# Patient Record
Sex: Female | Born: 1975 | Race: Black or African American | Hispanic: No | Marital: Married | Smoking: Never smoker
Health system: Southern US, Community
[De-identification: ages and names within clinical notes are randomized; demographics above are authoritative.]

## PROBLEM LIST (undated history)

## (undated) DIAGNOSIS — D259 Leiomyoma of uterus, unspecified: Secondary | ICD-10-CM

## (undated) DIAGNOSIS — Z973 Presence of spectacles and contact lenses: Secondary | ICD-10-CM

## (undated) DIAGNOSIS — K7469 Other cirrhosis of liver: Secondary | ICD-10-CM

## (undated) DIAGNOSIS — N920 Excessive and frequent menstruation with regular cycle: Secondary | ICD-10-CM

## (undated) DIAGNOSIS — K754 Autoimmune hepatitis: Secondary | ICD-10-CM

## (undated) DIAGNOSIS — D509 Iron deficiency anemia, unspecified: Secondary | ICD-10-CM

## (undated) HISTORY — PX: BUNIONECTOMY: SHX129

---

## 1997-06-25 HISTORY — PX: BUNIONECTOMY: SHX129

## 2000-01-08 ENCOUNTER — Encounter: Admission: RE | Admit: 2000-01-08 | Discharge: 2000-01-08 | Payer: Self-pay

## 2006-07-17 ENCOUNTER — Encounter: Payer: Self-pay | Admitting: Family Medicine

## 2006-07-17 LAB — CONVERTED CEMR LAB
HCT: 32.3 % — ABNORMAL LOW (ref 36.0–46.0)
Hemoglobin: 10.9 g/dL — ABNORMAL LOW (ref 12.0–15.0)
MCV: 83 fL (ref 78.0–100.0)
Platelets: 328 10*3/uL (ref 150–400)
Prolactin: 10.5 ng/mL
RDW: 14.4 % — ABNORMAL HIGH (ref 11.5–14.0)
WBC: 7.3 10*3/uL (ref 4.0–10.5)

## 2006-08-22 ENCOUNTER — Ambulatory Visit (HOSPITAL_COMMUNITY): Admission: RE | Admit: 2006-08-22 | Discharge: 2006-08-22 | Payer: Self-pay | Admitting: Obstetrics and Gynecology

## 2006-08-22 ENCOUNTER — Encounter (INDEPENDENT_AMBULATORY_CARE_PROVIDER_SITE_OTHER): Payer: Self-pay | Admitting: Specialist

## 2006-08-22 HISTORY — PX: HYSTEROSCOPY WITH D & C: SHX1775

## 2007-05-28 ENCOUNTER — Encounter: Admission: RE | Admit: 2007-05-28 | Discharge: 2007-05-28 | Payer: Self-pay | Admitting: Obstetrics and Gynecology

## 2007-07-01 ENCOUNTER — Ambulatory Visit (HOSPITAL_COMMUNITY): Admission: RE | Admit: 2007-07-01 | Discharge: 2007-07-01 | Payer: Self-pay | Admitting: Obstetrics and Gynecology

## 2008-05-11 ENCOUNTER — Encounter: Admission: RE | Admit: 2008-05-11 | Discharge: 2008-05-11 | Payer: Self-pay | Admitting: Obstetrics and Gynecology

## 2008-12-06 IMAGING — RF DG HYSTEROGRAM
3 series · 3 of 3 positions shown · non-contrast
Comparison: none

07/02/07 ? CORRECTED REPORT ? This exam was performed by Dr. Mariuxi Milford.
CLINICAL DATA: Infertility.
 HYSTEROSALPINGOGRAM:
TECHNIQUE: The exam was performed by Dr. Eveling and 3 images are submitted for interpretation.

[Series 1: run · 1 of 1 slices shown (1 of 3)]
[im 1/1]
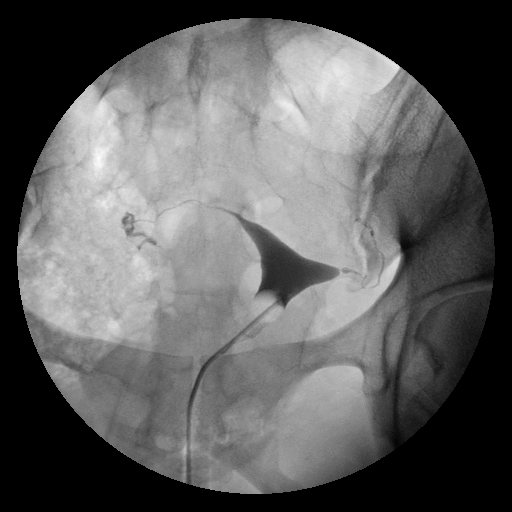

[Series 2: run · 1 of 1 slices shown (2 of 3)]
[im 1/1]
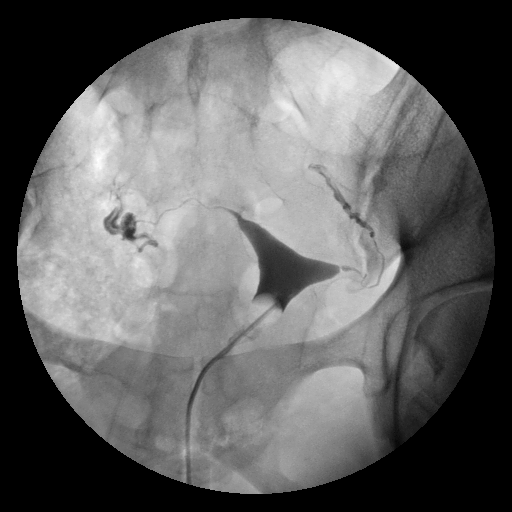

[Series 3: run · 1 of 1 slices shown (3 of 3)]
[im 1/1]
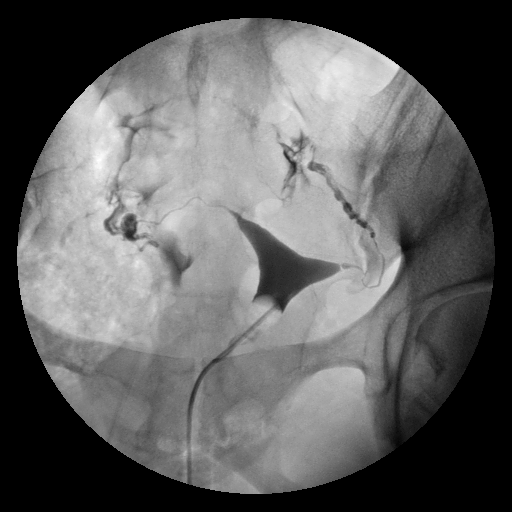

[3 of 3 positions shown; findings below may reference images not displayed]

FINDINGS: The uterus has a normal appearance with no fixed filling defects. Both fallopian tubes spill and have a normal appearance. There is free spill of contrast into both adnexal regions.
IMPRESSION: Normal HSG.

## 2009-07-27 ENCOUNTER — Ambulatory Visit (HOSPITAL_COMMUNITY): Admission: RE | Admit: 2009-07-27 | Discharge: 2009-07-27 | Payer: Self-pay | Admitting: Obstetrics and Gynecology

## 2009-08-31 ENCOUNTER — Ambulatory Visit (HOSPITAL_COMMUNITY): Admission: RE | Admit: 2009-08-31 | Discharge: 2009-08-31 | Payer: Self-pay | Admitting: Obstetrics and Gynecology

## 2009-09-13 ENCOUNTER — Inpatient Hospital Stay (HOSPITAL_COMMUNITY): Admission: AD | Admit: 2009-09-13 | Discharge: 2009-09-17 | Payer: Self-pay | Admitting: Obstetrics and Gynecology

## 2009-09-13 ENCOUNTER — Encounter (INDEPENDENT_AMBULATORY_CARE_PROVIDER_SITE_OTHER): Payer: Self-pay | Admitting: Obstetrics and Gynecology

## 2009-09-13 ENCOUNTER — Encounter: Payer: Self-pay | Admitting: Obstetrics and Gynecology

## 2009-09-23 DEATH — deceased

## 2010-09-17 LAB — TORCH-IGM(TOXO/ RUB/ CMV/ HSV) W TITER
CMV IgM: 0.28 Index (ref <0.90)
HSV IgM Ab SCREEN: NOT DETECTED
Rubella IgM Index: <0.9 Ratio (ref <0.90)
Toxoplasma IgM: NEGATIVE

## 2010-09-17 LAB — COMPREHENSIVE METABOLIC PANEL
ALT: 13 U/L (ref 0–35)
AST: 32 U/L (ref 0–37)
AST: 34 U/L (ref 0–37)
Albumin: 2.5 g/dL — ABNORMAL LOW (ref 3.5–5.2)
Alkaline Phosphatase: 101 U/L (ref 39–117)
Alkaline Phosphatase: 101 U/L (ref 39–117)
Alkaline Phosphatase: 133 U/L — ABNORMAL HIGH (ref 39–117)
BUN: 6 mg/dL (ref 6–23)
CO2: 24 mEq/L (ref 19–32)
Calcium: 8.4 mg/dL (ref 8.4–10.5)
Calcium: 8.7 mg/dL (ref 8.4–10.5)
Chloride: 104 mEq/L (ref 96–112)
Chloride: 107 mEq/L (ref 96–112)
Creatinine, Ser: 0.69 mg/dL (ref 0.4–1.2)
GFR calc Af Amer: >60 mL/min (ref >60)
GFR calc non Af Amer: >60 mL/min (ref >60)
GFR calc non Af Amer: >60 mL/min (ref >60)
Glucose, Bld: 70 mg/dL (ref 70–99)
Glucose, Bld: 91 mg/dL (ref 70–99)
Potassium: 3.5 mEq/L (ref 3.5–5.1)
Potassium: 4.1 mEq/L (ref 3.5–5.1)
Sodium: 137 mEq/L (ref 135–145)
Total Bilirubin: 0.1 mg/dL — ABNORMAL LOW (ref 0.3–1.2)
Total Bilirubin: 0.4 mg/dL (ref 0.3–1.2)
Total Protein: 6.2 g/dL (ref 6.0–8.3)
Total Protein: 7.4 g/dL (ref 6.0–8.3)

## 2010-09-17 LAB — LUPUS ANTICOAGULANT PANEL
Lupus Anticoagulant: NOT DETECTED
dRVVT Incubated 1:1 Mix: 40.1 secs (ref 36.2–44.3)

## 2010-09-17 LAB — URINALYSIS, MICROSCOPIC ONLY
Bilirubin Urine: NEGATIVE
Ketones, ur: NEGATIVE mg/dL
Protein, ur: 30 mg/dL — AB
Urobilinogen, UA: 0.2 mg/dL (ref 0.0–1.0)

## 2010-09-17 LAB — ANTI-NUCLEAR AB-TITER (ANA TITER): ANA Titer 1: 1:640 {titer} — ABNORMAL HIGH

## 2010-09-17 LAB — CBC
HCT: 25.9 % — ABNORMAL LOW (ref 36.0–46.0)
Hemoglobin: 10.5 g/dL — ABNORMAL LOW (ref 12.0–15.0)
Hemoglobin: 8.9 g/dL — ABNORMAL LOW (ref 12.0–15.0)
Platelets: 183 10*3/uL (ref 150–400)
Platelets: 187 10*3/uL (ref 150–400)
RBC: 2.82 MIL/uL — ABNORMAL LOW (ref 3.87–5.11)
RBC: 3.43 MIL/uL — ABNORMAL LOW (ref 3.87–5.11)
RDW: 12.7 % (ref 11.5–15.5)
RDW: 13.2 % (ref 11.5–15.5)
WBC: 17.4 10*3/uL — ABNORMAL HIGH (ref 4.0–10.5)
WBC: 19.3 10*3/uL — ABNORMAL HIGH (ref 4.0–10.5)

## 2010-09-17 LAB — RPR: RPR Ser Ql: NONREACTIVE

## 2010-09-17 LAB — PROTEIN, URINE, 24 HOUR
Collection Interval-UPROT: 24 hours
Protein, 24H Urine: 741 mg/d — ABNORMAL HIGH (ref 50–100)
Protein, Urine: 26 mg/dL

## 2010-09-17 LAB — CARDIOLIPIN ANTIBODIES, IGG, IGM, IGA
Anticardiolipin IgA: 14 APL U/mL (ref <22)
Anticardiolipin IgG: 3 GPL U/mL — ABNORMAL LOW (ref <23)
Anticardiolipin IgM: 2 MPL U/mL — ABNORMAL LOW (ref <11)

## 2010-09-17 LAB — CREATININE, URINE, 24 HOUR: Collection Interval-UCRE24: 24 hours

## 2010-09-17 LAB — LACTATE DEHYDROGENASE: LDH: 254 U/L — ABNORMAL HIGH (ref 94–250)

## 2010-09-17 LAB — URINE CULTURE

## 2010-09-17 LAB — MRSA PCR SCREENING: MRSA by PCR: NEGATIVE

## 2010-09-17 LAB — URIC ACID: Uric Acid, Serum: 4.8 mg/dL (ref 2.4–7.0)

## 2010-11-10 NOTE — Op Note (Signed)
NAMESHOMARI, MATUSIK    ACCOUNT NO.:  [id number]   MEDICAL RECORD NO.:  [id number]          PATIENT TYPE:  AMB   LOCATION:  SDC                           FACILITY:  [facility]   PHYSICIAN:  Hal Morales, M.D.DATE OF BIRTH:  06/06/1976   DATE OF PROCEDURE:  08/22/2006  DATE OF DISCHARGE:                               OPERATIVE REPORT   PREOPERATIVE DIAGNOSIS:  Premenstrual spotting and endometrial polyp.   POSTOPERATIVE DIAGNOSIS:  Premenstrual spotting, necrotic endometrial  polypoid lesion.   PROCEDURE:  Hysteroscopy, dilatation and curettage.   SURGEON:  Hal Morales, M.D.   ANESTHESIA:  General LMA.   ESTIMATED BLOOD LOSS:  Less than 10 mL.   COMPLICATIONS:  None.   FINDINGS:  At the time of hysteroscopy, there was a fairly large  necrotic polypoid looking area that initially was thought to be clot.  There was no clear attachment to the endometrial cavity.  The remainder  of the cavity was clear without other endometrial lesions.  There were  no endocervical lesions noted at the time of hysteroscopy.   PROCEDURE:  The patient was taken to the operating room after  appropriate identification and placed on the operating table.  After  institution of general anesthesia, she was placed in the lithotomy  position.  The perineum and vagina were prepped with multiple layers of  Betadine and a non-latex catheter used to empty the bladder under  sterile conditions.  The perineum was draped as a sterile field.  A  weighted speculum was placed in the posterior vagina and a paracervical  block achieved with a total of 10 mL of 2% Xylocaine at the 5 and 7  o'clock positions.  The cervix was then grasped anteriorly with a single  tooth tenaculum and the uterus sounded to 9 cm.  The cervix was then  dilated to a #23 dilator to accommodate the diagnostic hysteroscope.  The diagnostic hysteroscope was then used to observe and document the  above noted findings.  The  Randall stone forceps were used to remove the  necrotic looking polypoid material and visualization of the endometrial  cavity revealed no other lesions.  The endometrial cavity was sharply  curetted and those curettings removed from the operative field.  At this  time, all instruments were removed from the vagina and the patient was  awakened from general anesthesia.  She was taken from the operating room  to the recovery room in satisfactory condition having tolerated the  procedure well with sponge and instrument counts correct.   SPECIMENS TO PATHOLOGY:  Endometrial curettings.   DISCHARGE INSTRUCTIONS:  The patient is given printed instructions for  D&C from the [facility].   DISCHARGE MEDICATIONS:  Prenatal vitamins one p.o. daily, ibuprofen over-  the-counter 600 mg p.o. q.6-8h. p.r.n. pain, clomiphene citrate 50 mg  one p.o. daily from February 28 through March 3.   FOLLOW-UP INSTRUCTIONS:  The patient is to follow-up in two weeks at  [facility].      Hal Morales, M.D.  Electronically Signed     VPH/MEDQ  D:  08/22/2006  T:  08/22/2006  Job:  [id number]

## 2010-12-18 ENCOUNTER — Other Ambulatory Visit: Payer: Self-pay | Admitting: Obstetrics and Gynecology

## 2010-12-18 DIAGNOSIS — Z1231 Encounter for screening mammogram for malignant neoplasm of breast: Secondary | ICD-10-CM

## 2010-12-26 ENCOUNTER — Ambulatory Visit
Admission: RE | Admit: 2010-12-26 | Discharge: 2010-12-26 | Disposition: A | Discharge status: Home / Self Care | Payer: Managed Care, Other (non HMO) | Source: Ambulatory Visit | Attending: Obstetrics and Gynecology | Admitting: Obstetrics and Gynecology

## 2010-12-26 DIAGNOSIS — Z1231 Encounter for screening mammogram for malignant neoplasm of breast: Secondary | ICD-10-CM

## 2011-02-19 IMAGING — US US OB FOLLOW-UP
1 series · 14 of 28 positions shown · non-contrast
Comparison: none

OBSTETRICAL ULTRASOUND:
 This ultrasound was performed in The [HOSPITAL], and the [facility] report will be stored to [REDACTED] PACS.  This report is also available in [HOSPITAL]?s accessANYware.

[Series 1: us ob follow-up · 14 of 69 slices shown]
[im 3/69]
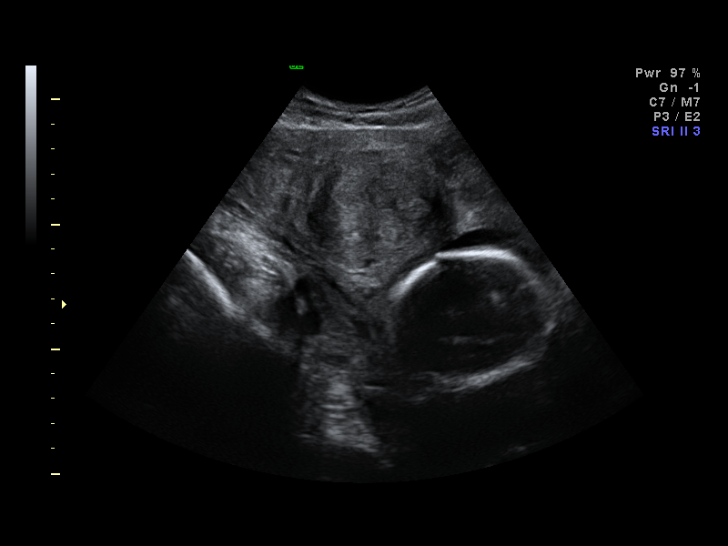
[im 8/69]
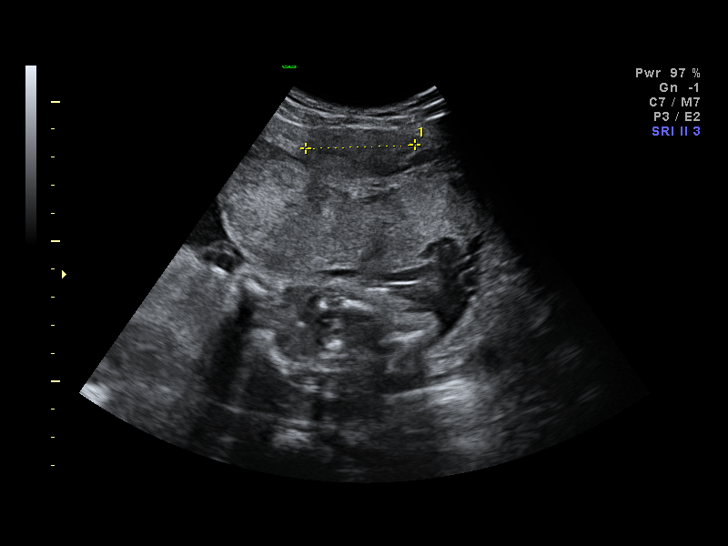
[im 13/69]
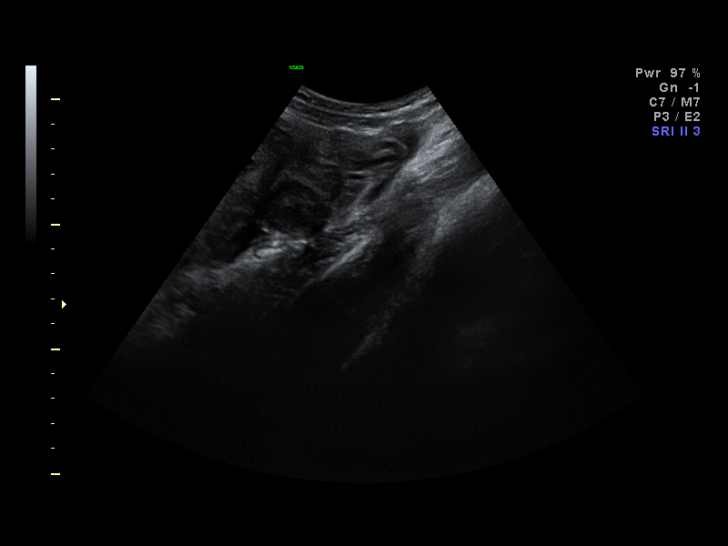
[im 18/69]
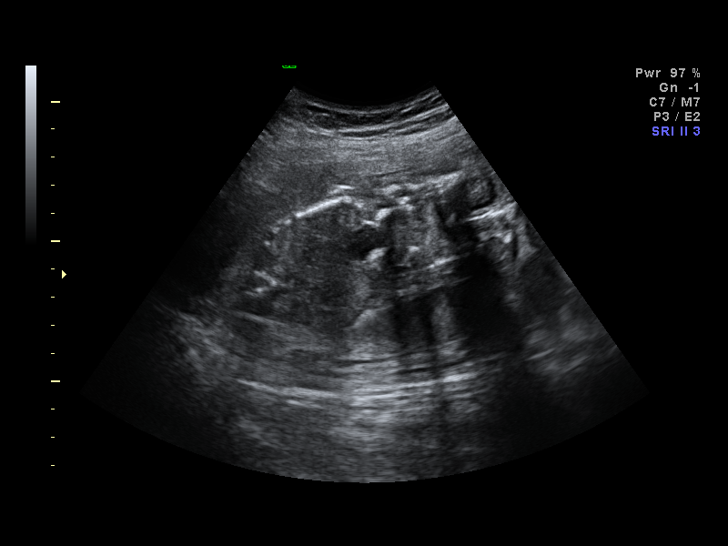
[im 23/69]
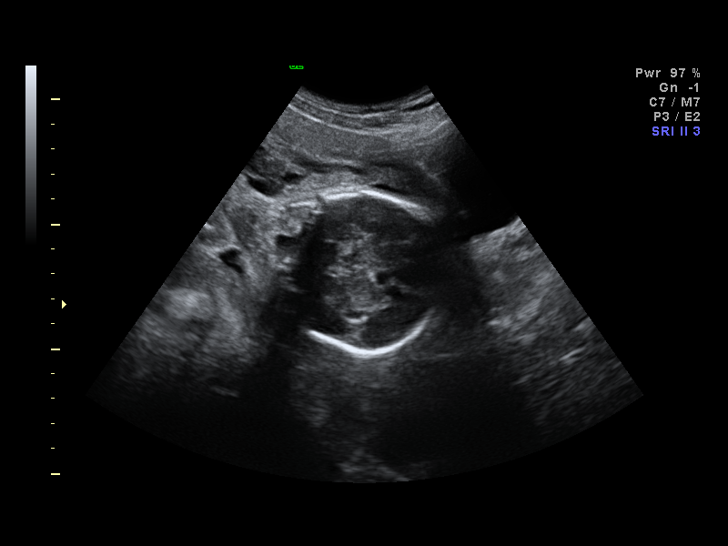
[im 28/69]
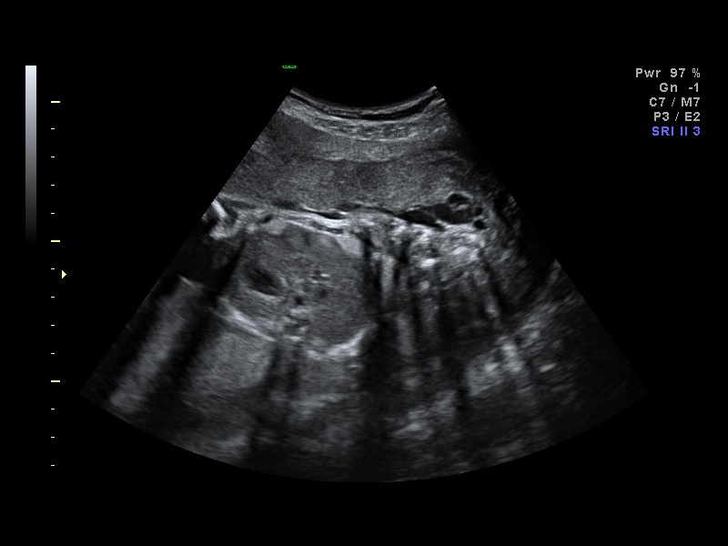
[im 33/69]
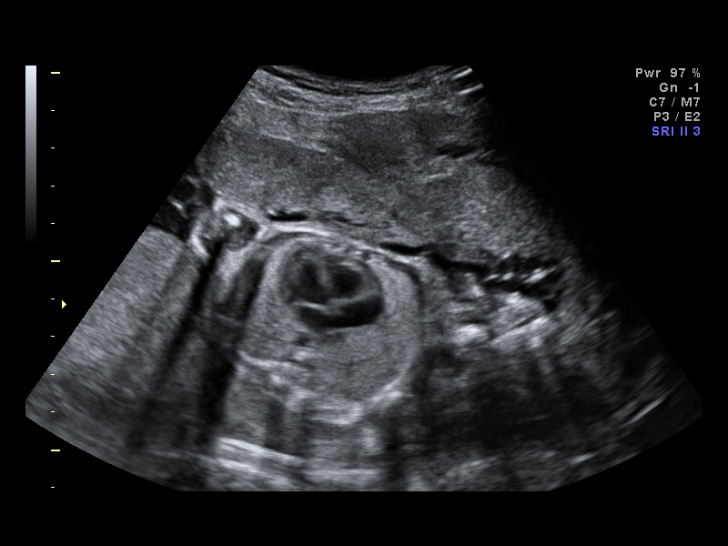
[im 38/69]
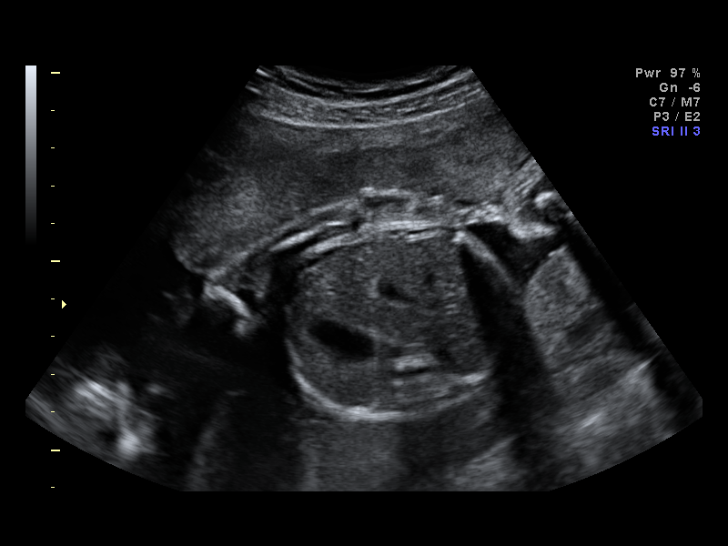
[im 43/69]
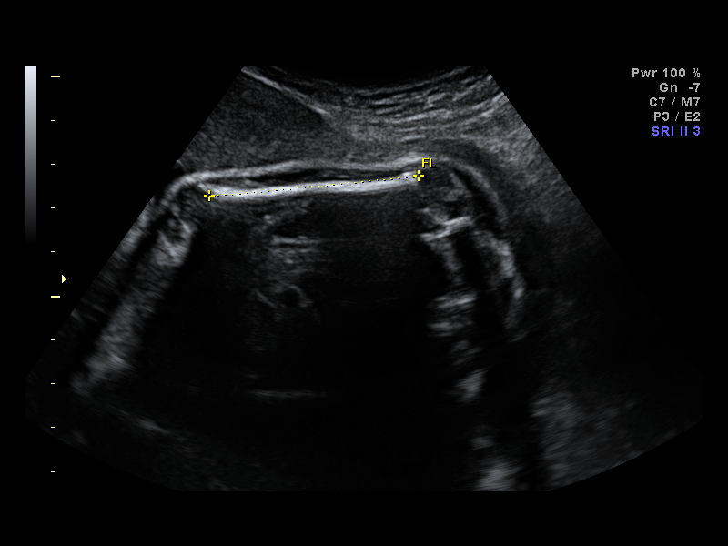
[im 48/69]
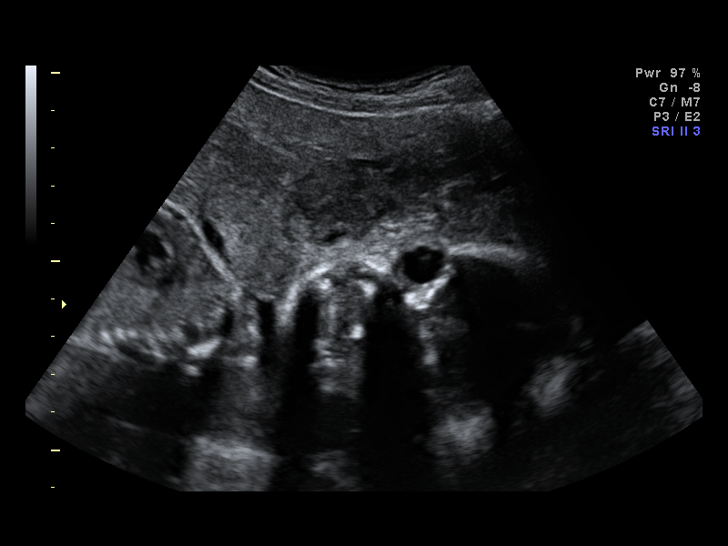
[im 53/69]
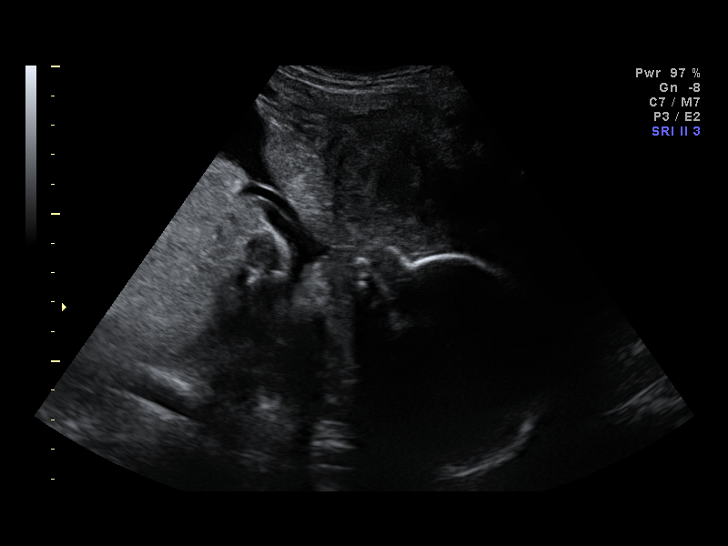
[im 58/69]
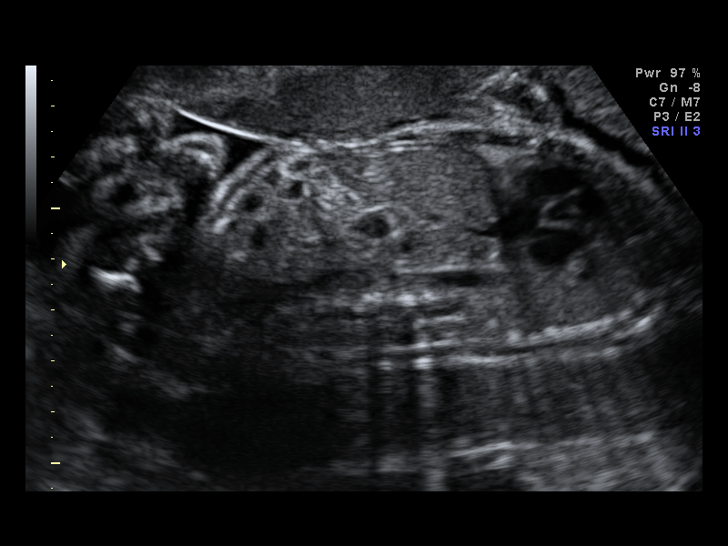
[im 63/69]
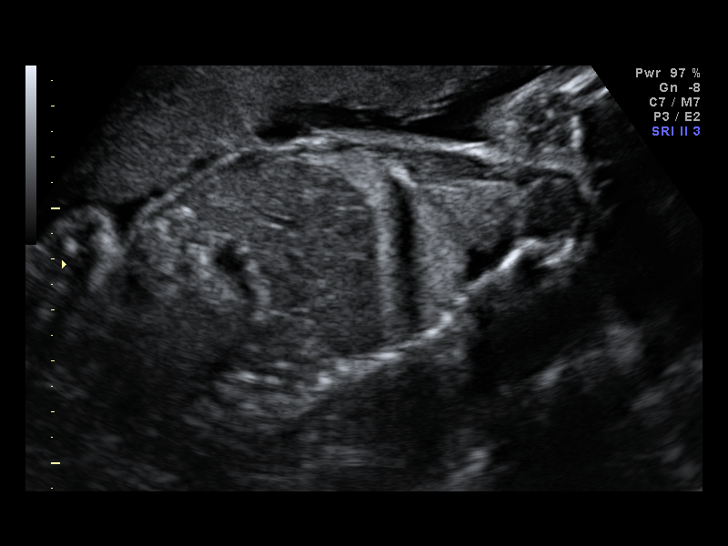
[im 69/69]
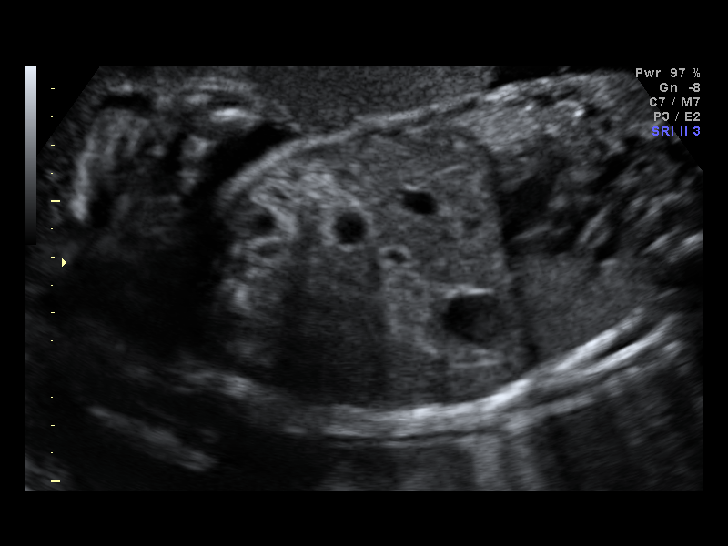

[14 of 28 positions shown; findings below may reference images not displayed]

IMPRESSION: [facility] has also been faxed to the ordering physician.

## 2011-05-15 ENCOUNTER — Encounter (HOSPITAL_COMMUNITY): Payer: Self-pay

## 2011-05-15 ENCOUNTER — Inpatient Hospital Stay (HOSPITAL_COMMUNITY): Payer: Managed Care, Other (non HMO)

## 2011-05-15 ENCOUNTER — Inpatient Hospital Stay (HOSPITAL_COMMUNITY)
Admission: AD | Admit: 2011-05-15 | Discharge: 2011-05-15 | Disposition: A | Discharge status: Home / Self Care | Payer: Managed Care, Other (non HMO) | Source: Ambulatory Visit | Attending: Obstetrics and Gynecology | Admitting: Obstetrics and Gynecology

## 2011-05-15 DIAGNOSIS — O09529 Supervision of elderly multigravida, unspecified trimester: Secondary | ICD-10-CM

## 2011-05-15 DIAGNOSIS — O26859 Spotting complicating pregnancy, unspecified trimester: Secondary | ICD-10-CM | POA: Insufficient documentation

## 2011-05-15 DIAGNOSIS — O209 Hemorrhage in early pregnancy, unspecified: Secondary | ICD-10-CM

## 2011-05-15 HISTORY — DX: Autoimmune hepatitis: K75.4

## 2011-05-15 LAB — URINALYSIS, ROUTINE W REFLEX MICROSCOPIC
Glucose, UA: NEGATIVE mg/dL
Hgb urine dipstick: NEGATIVE
Leukocytes, UA: NEGATIVE
Protein, ur: NEGATIVE mg/dL
pH: 6 (ref 5.0–8.0)

## 2011-05-15 LAB — CBC
HCT: 29.9 % — ABNORMAL LOW (ref 36.0–46.0)
Hemoglobin: 10.2 g/dL — ABNORMAL LOW (ref 12.0–15.0)
MCHC: 34.1 g/dL (ref 30.0–36.0)
RBC: 3.53 MIL/uL — ABNORMAL LOW (ref 3.87–5.11)

## 2011-05-15 LAB — WET PREP, GENITAL
Clue Cells Wet Prep HPF POC: NONE SEEN
Trich, Wet Prep: NONE SEEN
Yeast Wet Prep HPF POC: NONE SEEN

## 2011-05-15 LAB — HCG, QUANTITATIVE, PREGNANCY: hCG, Beta Chain, Quant, S: 12516 m[IU]/mL — ABNORMAL HIGH (ref <5)

## 2011-05-15 NOTE — Plan of Care (Signed)
Not in the lobby when called to triage.  

## 2011-05-15 NOTE — ED Provider Notes (Signed)
History   35 yo G3P0111 at 8 3/7 weeks by LMP presented c/o brown spotting today, denies pain.  Has appointment for visit and [place] next Tuesday at [facility].  Blood type O+ from last pregnancy.  Hx remarkable for: Hx previous twin pregnancy, with IUFD of Twin B at 88 weeks AMA Hx infertility Autoimmune hepatitis--on Prednisone 5 mg po q day Fibroids Previous C/S due to [facility]  Chief Complaint  Patient presents with  . Vaginal Bleeding     OB History    Grav Para Term Preterm Abortions TAB SAB Ect Mult Living   3 1  1 1  1   1       Past Medical History  Diagnosis Date  . Hepatitis, autoimmune     Past Surgical History  Procedure Date  . Cesarean section   . Bunionectomy     right     Family History  Problem Relation Age of Onset  . Anesthesia problems Neg Hx   . Hypotension Neg Hx   . Malignant hyperthermia Neg Hx   . Pseudochol deficiency Neg Hx     History  Substance Use Topics  . Smoking status: Never Smoker   . Smokeless tobacco: Never Used  . Alcohol Use: No    Allergies: No Known Allergies  Prescriptions prior to admission  Medication Sig Dispense Refill  . predniSONE (DELTASONE) 5 MG tablet Take 5 mg by mouth daily.        . prenatal vitamin w/FE, FA (PRENATAL 1 + 1) 27-1 MG TABS Take 1 tablet by mouth daily.           Physical Exam   Blood pressure 129/75, pulse 95, temperature 98.4 F (36.9 C), temperature source Oral, resp. rate 16, height 5' 8.5" (1.74 m), weight 69.037 kg (152 lb 3.2 oz), last menstrual period 03/17/2011, SpO2 99.00%, unknown if currently breastfeeding.  Chest clear Heart RRR without murmur Abd soft, NT Pelvic--no blood in vault, cervix closed, long/firm. Uterus slightly enlarged, irregular in shape, NT   *RADIOLOGY REPORT*  Clinical Data: First trimester pregnancy with vaginal spotting.  Serum beta HCG level 12,516.  OBSTETRIC <14 WK ULTRASOUND  Technique: Transabdominal ultrasound was performed for evaluation  of the  gestation as well as the maternal uterus and adnexal  regions.  Comparison: None.  Intrauterine gestational sac: Visualized/normal in shape.  Yolk sac: Visualized  Embryo: Visualized  Cardiac Activity: Visualized  Heart Rate: Not accurately measured  CRL: 4.4 mm; 6 weeks 1 day [place] EDC: 01/07/2012  Maternal uterus/Adnexae:  Multiple maternal uterine fibroids are noted, the largest measuring  6.2 x 5.5 x 4.1 cm. There is a complex right ovarian cyst  measuring 4.3 x 2.8 x 4.1 cm. The left ovary demonstrates a small  simple cyst which measures 3.0 x 2.9 x 2.1 cm. No free pelvic  fluid is present.  IMPRESSION:  1. Single live intrauterine gestation with best estimated  gestational age of [redacted] weeks 1 day. Fetal heart rate is not  accurately measurable. Several follow-up of the beta HCG levels is  suggested (with follow-up ultrasound as warranted clinically) to  confirm viability.  2. Multiple maternal uterine fibroids and complex right ovarian  cyst. Ultrasound followup suggested.  Original Report Authenticated By: Gerrianne Scale, M.D.    ED Course  IUP at 6 1/7 weeks  1st trimester spotting Rh+  Plan: D/C home with bleeding precautions. GC, chlamydia done. Keep scheduled appointment with [facility] next Tuesday or call prn. Discussed patient with  Dr. Normand Sloop.  Nigel Bridgeman, CNM, [place] 05/15/11 2030

## 2011-05-15 NOTE — Progress Notes (Signed)
Patient states she has had brown spotting since this am on and off, no pain.

## 2011-05-15 NOTE — ED Notes (Signed)
Hillary Steelman CNM notified of pt's arrival. She reports plan is for Nigel Bridgeman CNM to see pt.

## 2011-05-16 LAB — GC/CHLAMYDIA PROBE AMP, GENITAL
Chlamydia, DNA Probe: NEGATIVE
GC Probe Amp, Genital: NEGATIVE

## 2012-10-20 IMAGING — US US OB COMP LESS 14 WK
1 series · 13 of 28 positions shown · non-contrast
Comparison: None.

CLINICAL DATA: First trimester pregnancy with vaginal spotting.
Serum beta HCG level [DATE].

OBSTETRIC <14 WK ULTRASOUND
TECHNIQUE: Transabdominal ultrasound was performed for evaluation
of the gestation as well as the maternal uterus and adnexal
regions.

[Series 1: us ob comp less 14 wks · 13 of 39 slices shown]
[im 2/39]
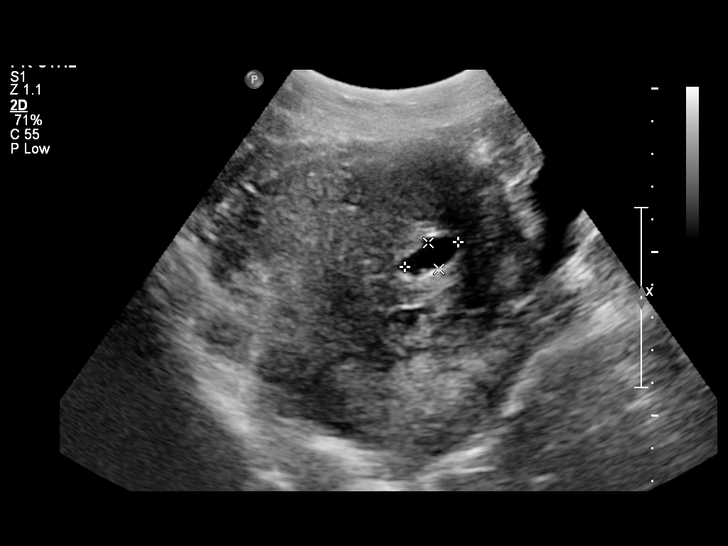
[im 5/39]
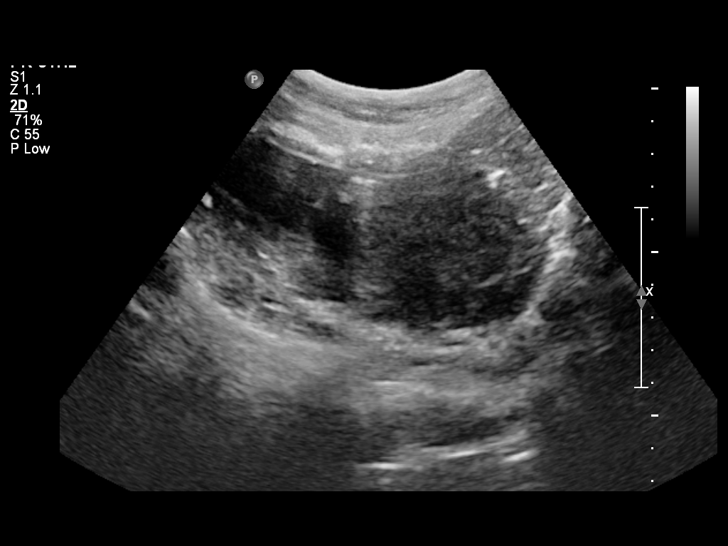
[im 8/39]
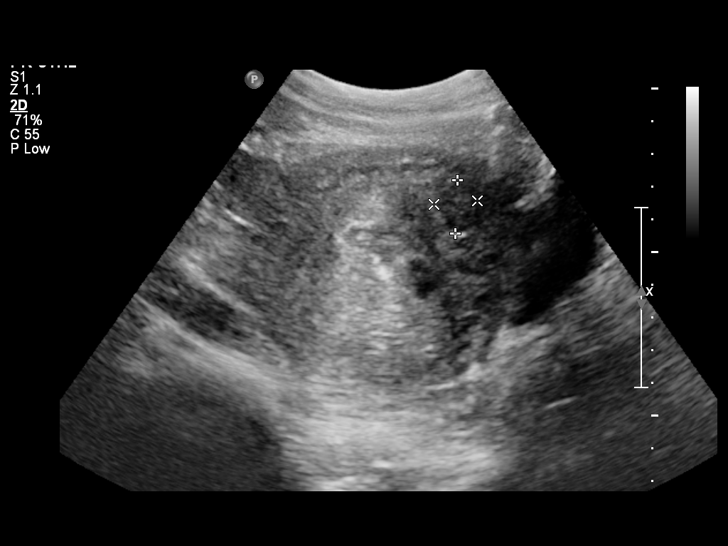
[im 10/39]
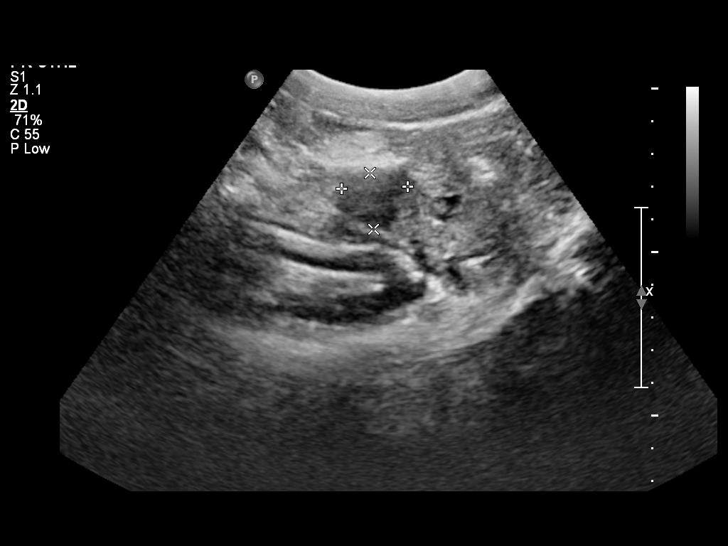
[im 13/39]
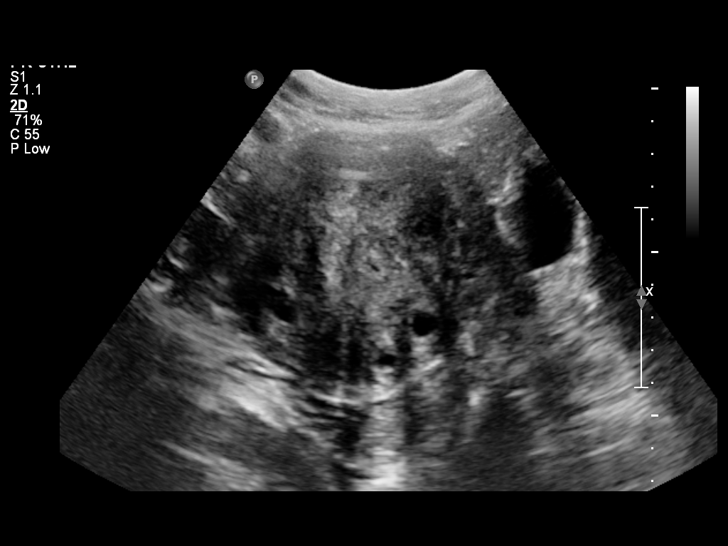
[im 16/39]
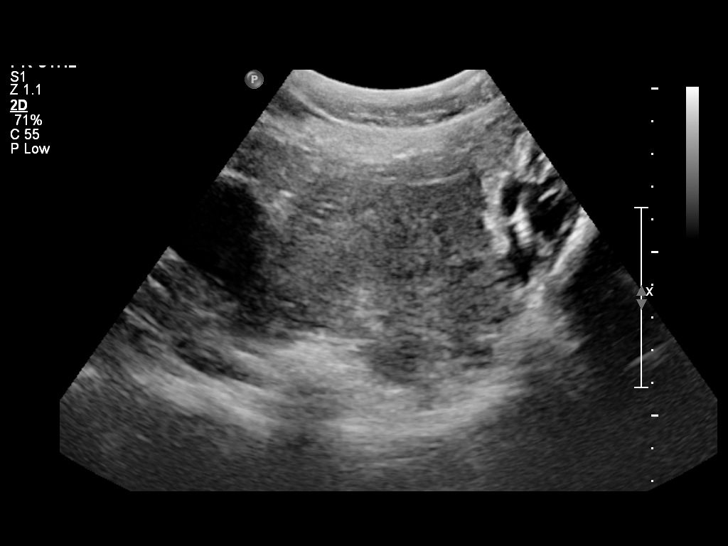
[im 20/39]
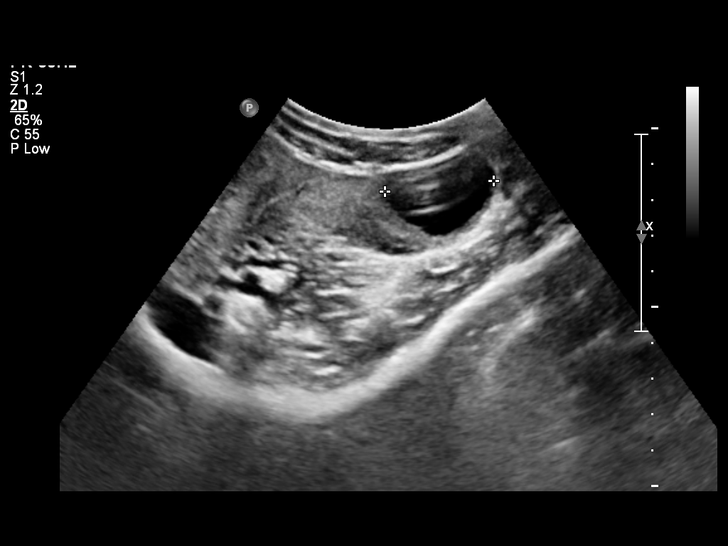
[im 23/39]
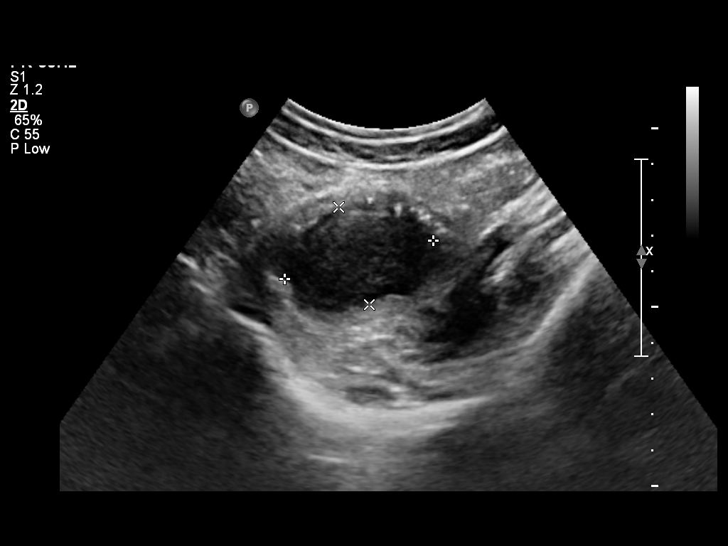
[im 26/39]
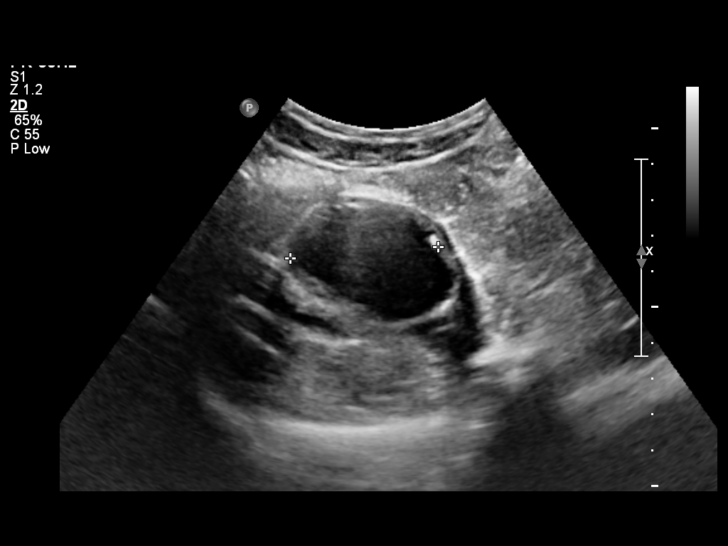
[im 29/39]
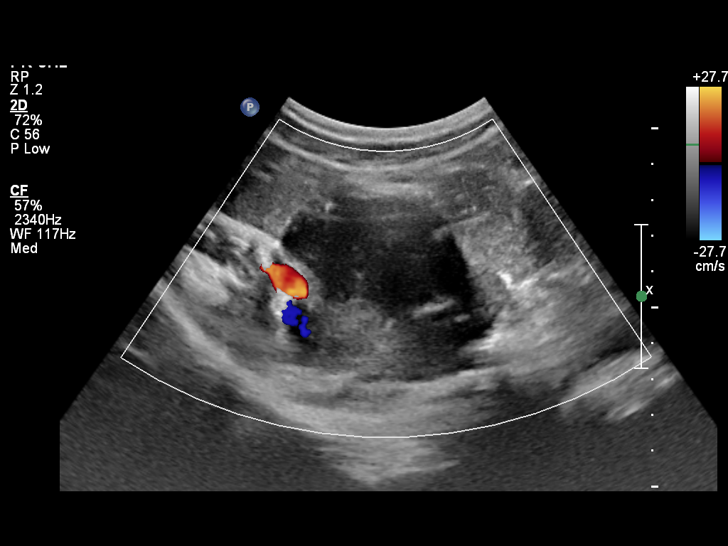
[im 31/39]
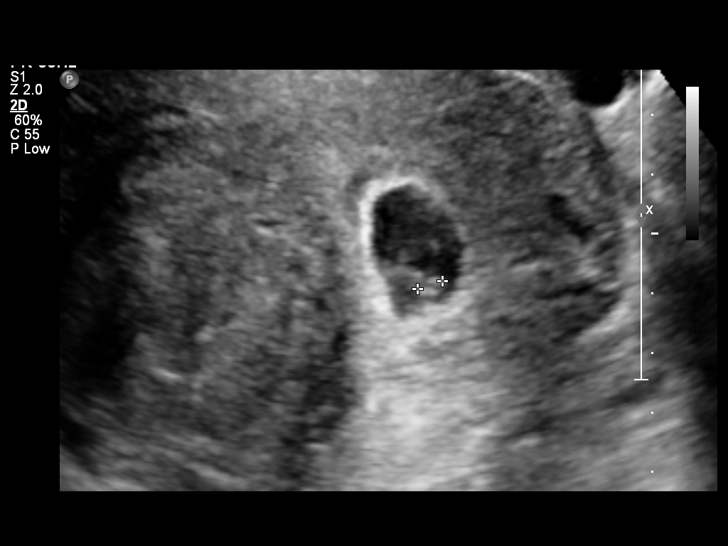
[im 34/39]
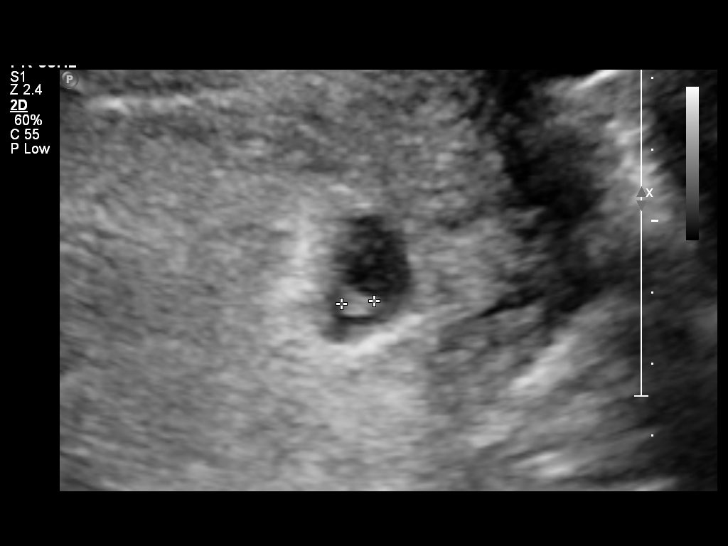
[im 37/39]
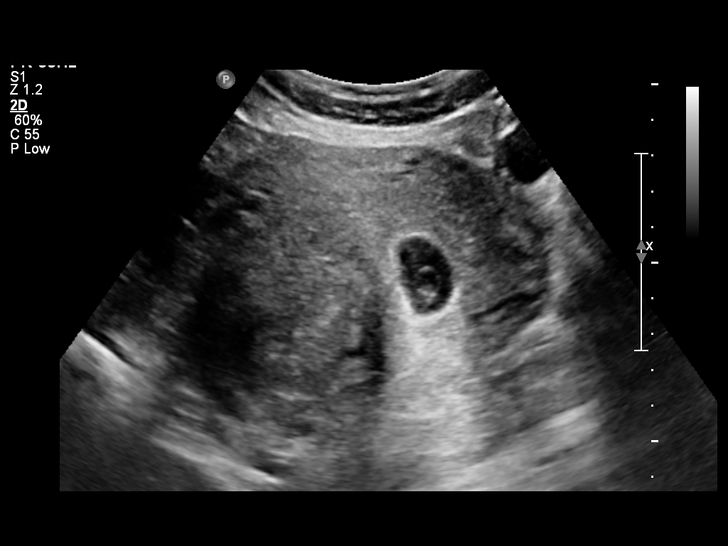

[13 of 28 positions shown; findings below may reference images not displayed]

Intrauterine gestational sac: Visualized/normal in shape.
Yolk sac: Visualized
Embryo: Visualized
Cardiac Activity: Visualized
Heart Rate: Not accurately measured

CRL:  4.4 mm;  6 weeks 1 day        US EDC: 01/07/2012

Maternal uterus/Adnexae:
Multiple maternal uterine fibroids are noted, the largest measuring
6.2 x 5.5 x 4.1 cm.  There is a complex right ovarian cyst
measuring 4.3 x 2.8 x 4.1 cm.  The left ovary demonstrates a small
simple cyst which measures 3.0 x 2.9 x 2.1 cm.  No free pelvic
fluid is present.
IMPRESSION: 1.  Single live intrauterine gestation with best estimated
gestational age of 6 weeks 1 day.  Fetal heart rate is not
accurately measurable.  Several follow-up of the beta HCG levels is
suggested (with follow-up ultrasound as warranted clinically) to
confirm viability.
2.  Multiple maternal uterine fibroids and complex right ovarian
cyst. Ultrasound followup suggested.

## 2014-04-26 ENCOUNTER — Encounter (HOSPITAL_COMMUNITY): Payer: Self-pay

## 2019-06-10 ENCOUNTER — Ambulatory Visit (INDEPENDENT_AMBULATORY_CARE_PROVIDER_SITE_OTHER): Payer: Managed Care, Other (non HMO) | Admitting: Family Medicine

## 2019-06-10 ENCOUNTER — Other Ambulatory Visit: Payer: Self-pay

## 2019-06-10 ENCOUNTER — Encounter: Payer: Self-pay | Admitting: Family Medicine

## 2019-06-10 VITALS — BP 110/64 | HR 96 | Temp 98.6°F | Resp 14 | Ht 68.0 in | Wt 157.0 lb

## 2019-06-10 DIAGNOSIS — Z0001 Encounter for general adult medical examination with abnormal findings: Secondary | ICD-10-CM

## 2019-06-10 DIAGNOSIS — D508 Other iron deficiency anemias: Secondary | ICD-10-CM

## 2019-06-10 DIAGNOSIS — Z1231 Encounter for screening mammogram for malignant neoplasm of breast: Secondary | ICD-10-CM | POA: Diagnosis not present

## 2019-06-10 DIAGNOSIS — K754 Autoimmune hepatitis: Secondary | ICD-10-CM | POA: Diagnosis not present

## 2019-06-10 DIAGNOSIS — R7989 Other specified abnormal findings of blood chemistry: Secondary | ICD-10-CM

## 2019-06-10 DIAGNOSIS — Z0000 Encounter for general adult medical examination without abnormal findings: Secondary | ICD-10-CM

## 2019-06-10 NOTE — Patient Instructions (Addendum)
Schedule mammogram at breast center in [place]  We send results via mychart  F/U 1 year for physical

## 2019-06-10 NOTE — Progress Notes (Addendum)
   Subjective:    Patient ID: Veronica Estrada, female    DOB: 22-Sep-1975, 43 y.o.   MRN: [id number]  Patient presents for New Patient- Establish Care (is fasting)  Pt here to establish care. 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 Wynn Banker [facility] clinic , but has not had PCP in a few years  Overdue for PAP Smear/ has very heavy periods and interested in IUD insertion, she has scheduled with GYN- [place]    Age  43  Had fibroadenoma, has not had another mammogram since then, no family history of breat cancer      Concern for anemia, eats a lot of ince, she does have history of anemia , no current iron supplement, no history of transfusion or iron transfusion.  Does have history of autoimmune hepatitis 2008/2009 - was following with liver specialist at [facility] , was on prednisone in the past  -she is due for repeat liver function test.  Family history reviewed as well as her past medical history Does have family history of hypothyroidism in her sister  She is due for fasting labs.  Immunizations are up-to-date   Review Of Systems:  GEN- denies fatigue, fever, weight loss,weakness, recent illness HEENT- denies eye drainage, change in vision, nasal discharge, CVS- denies chest pain, palpitations RESP- denies SOB, cough, wheeze ABD- denies N/V, change in stools, abd pain GU- denies dysuria, hematuria, dribbling, incontinence MSK- denies joint pain, muscle aches, injury Neuro- denies headache, dizziness, syncope, seizure activity       Objective:    BP 110/64   Pulse 96   Temp 98.6 F (37 C) (Temporal)   Resp 14   Ht 5\' 8"  (1.727 m)   Wt 157 lb (71.2 kg)   LMP 05/13/2019 Comment: regular  SpO2 98%   Breastfeeding Unknown   BMI 23.87 kg/m  GEN- NAD, alert and oriented x3 HEENT- PERRL, EOMI, non injected sclera, pink conjunctiva, MMM, oropharynx clear Neck- Supple, no thyromegaly CVS- RRR, no  murmur RESP-CTAB ABD-NABS,soft,NT,ND Psych normal affect and mood EXT- No edema Pulses- Radial, DP- 2+  Fall/audit C/depression screen negative      Assessment & Plan:      Problem List Items Addressed This Visit      Unprioritized   Autoimmune hepatitis ([place])    Chronically elevated LFT Re-establish with [facility]      Relevant Orders   Ambulatory referral to Gastroenterology   Iron deficiency anemia    See result note Start iron 325mg  TID       Other Visit Diagnoses    Routine general medical examination at a health care facility    -  Primary   CPE done.  Fasting labs obtained.  She is scheduled with GYN for her heavy menstrual cycle Pap smear and IUD.  Mammogram to be done   Relevant Orders   CBC with Differential (Completed)   Comprehensive metabolic panel (Completed)   Lipid Panel (Completed)   TSH (Completed)   Encounter for screening mammogram for malignant neoplasm of breast       Relevant Orders   MM SCREENING BREAST TOMO BILATERAL   Elevated LFTs       Relevant Orders   Ambulatory referral to Gastroenterology      Note: This dictation was prepared with Dragon dictation along with smaller phrase technology. Any transcriptional errors that result from this process are unintentional.

## 2019-06-12 DIAGNOSIS — D509 Iron deficiency anemia, unspecified: Secondary | ICD-10-CM | POA: Insufficient documentation

## 2019-06-12 DIAGNOSIS — K754 Autoimmune hepatitis: Secondary | ICD-10-CM | POA: Insufficient documentation

## 2019-06-12 LAB — CBC WITH DIFFERENTIAL/PLATELET
Absolute Monocytes: 790 cells/uL (ref 200–950)
Basophils Absolute: 40 cells/uL (ref 0–200)
Basophils Relative: 0.8 %
Eosinophils Absolute: 180 cells/uL (ref 15–500)
Eosinophils Relative: 3.6 %
HCT: 21.5 % — ABNORMAL LOW (ref 35.0–45.0)
Hemoglobin: 6.1 g/dL — ABNORMAL LOW (ref 11.7–15.5)
Lymphs Abs: 2225 cells/uL (ref 850–3900)
MCH: 18.4 pg — ABNORMAL LOW (ref 27.0–33.0)
MCHC: 28.4 g/dL — ABNORMAL LOW (ref 32.0–36.0)
MCV: 64.8 fL — ABNORMAL LOW (ref 80.0–100.0)
Monocytes Relative: 15.8 %
Neutro Abs: 1765 cells/uL (ref 1500–7800)
Neutrophils Relative %: 35.3 %
Platelets: 179 10*3/uL (ref 140–400)
RBC: 3.32 10*6/uL — ABNORMAL LOW (ref 3.80–5.10)
RDW: 16.1 % — ABNORMAL HIGH (ref 11.0–15.0)
Total Lymphocyte: 44.5 %
WBC: 5 10*3/uL (ref 3.8–10.8)

## 2019-06-12 LAB — COMPREHENSIVE METABOLIC PANEL
AG Ratio: 0.7 (calc) — ABNORMAL LOW (ref 1.0–2.5)
ALT: 106 U/L — ABNORMAL HIGH (ref 6–29)
AST: 137 U/L — ABNORMAL HIGH (ref 10–35)
Albumin: 3.7 g/dL (ref 3.6–5.1)
Alkaline phosphatase (APISO): 76 U/L (ref 31–125)
BUN: 8 mg/dL (ref 7–25)
CO2: 21 mmol/L (ref 20–32)
Calcium: 9 mg/dL (ref 8.6–10.2)
Chloride: 107 mmol/L (ref 98–110)
Creat: 0.74 mg/dL (ref 0.50–1.10)
Globulin: 5.6 g/dL (calc) — ABNORMAL HIGH (ref 1.9–3.7)
Glucose, Bld: 82 mg/dL (ref 65–99)
Potassium: 4.2 mmol/L (ref 3.5–5.3)
Sodium: 136 mmol/L (ref 135–146)
Total Bilirubin: 0.9 mg/dL (ref 0.2–1.2)
Total Protein: 9.3 g/dL — ABNORMAL HIGH (ref 6.1–8.1)

## 2019-06-12 LAB — IRON,TIBC AND FERRITIN PANEL
%SAT: 3 % (calc) — ABNORMAL LOW (ref 16–45)
Ferritin: 3 ng/mL — ABNORMAL LOW (ref 16–232)
Iron: 16 ug/dL — ABNORMAL LOW (ref 40–190)
TIBC: 549 mcg/dL (calc) — ABNORMAL HIGH (ref 250–450)

## 2019-06-12 LAB — LIPID PANEL
Cholesterol: 116 mg/dL (ref <200)
HDL: 42 mg/dL — ABNORMAL LOW (ref >=50)
LDL Cholesterol (Calc): 61 mg/dL (calc)
Non-HDL Cholesterol (Calc): 74 mg/dL (calc) (ref <130)
Total CHOL/HDL Ratio: 2.8 (calc) (ref <5.0)
Triglycerides: 44 mg/dL (ref <150)

## 2019-06-12 LAB — TEST AUTHORIZATION

## 2019-06-12 LAB — TSH: TSH: 2.25 mIU/L

## 2019-06-12 LAB — HEPATITIS PANEL, ACUTE
Hep A IgM: NONREACTIVE
Hep B C IgM: NONREACTIVE
Hepatitis B Surface Ag: NONREACTIVE
Hepatitis C Ab: NONREACTIVE
SIGNAL TO CUT-OFF: 0.09 (ref <1.00)

## 2019-06-12 NOTE — Assessment & Plan Note (Signed)
Chronically elevated LFT Re-establish with Veronica Estrada

## 2019-06-12 NOTE — Assessment & Plan Note (Signed)
See result note Start iron 325mg  TID

## 2019-06-12 NOTE — Addendum Note (Signed)
Addended by: Vic Blackbird F on: 06/12/2019 01:34 PM   Modules accepted: Orders

## 2019-07-31 ENCOUNTER — Ambulatory Visit: Payer: Managed Care, Other (non HMO)

## 2019-09-04 ENCOUNTER — Ambulatory Visit
Admission: RE | Admit: 2019-09-04 | Discharge: 2019-09-04 | Disposition: A | Discharge status: Home / Self Care | Payer: 59 | Source: Ambulatory Visit | Attending: Family Medicine | Admitting: Family Medicine

## 2019-09-04 ENCOUNTER — Other Ambulatory Visit: Payer: Self-pay

## 2019-09-04 DIAGNOSIS — Z1231 Encounter for screening mammogram for malignant neoplasm of breast: Secondary | ICD-10-CM

## 2019-10-14 ENCOUNTER — Telehealth: Payer: Self-pay | Admitting: *Deleted

## 2019-10-14 ENCOUNTER — Other Ambulatory Visit: Payer: Self-pay | Admitting: *Deleted

## 2019-10-14 DIAGNOSIS — K754 Autoimmune hepatitis: Secondary | ICD-10-CM

## 2019-10-14 NOTE — Telephone Encounter (Signed)
Received return call from patient.  Patient reports that she would prefer to come to [facility] to have labs drawn. Future orders placed for standing labs.

## 2019-10-14 NOTE — Telephone Encounter (Signed)
Received fax from [facility].   Requested to have labs obtained as follows: CBC/ CMP (Dx: K75.4- autoimmune hepatitis) Q 2 weeks.  Results to be faxed back to: [place]: Dr. Enis Gash [phone] telephone [phone]~ fax.  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   Veronica Estrada

## 2019-10-16 ENCOUNTER — Other Ambulatory Visit: Payer: 59

## 2019-10-16 ENCOUNTER — Other Ambulatory Visit: Payer: Self-pay

## 2019-10-16 DIAGNOSIS — K754 Autoimmune hepatitis: Secondary | ICD-10-CM

## 2019-10-17 LAB — CBC WITH DIFFERENTIAL/PLATELET
Absolute Monocytes: 883 cells/uL (ref 200–950)
Basophils Absolute: 18 cells/uL (ref 0–200)
Basophils Relative: 0.2 %
Eosinophils Absolute: 27 cells/uL (ref 15–500)
Eosinophils Relative: 0.3 %
HCT: 32.3 % — ABNORMAL LOW (ref 35.0–45.0)
Hemoglobin: 10.6 g/dL — ABNORMAL LOW (ref 11.7–15.5)
Lymphs Abs: 2430 cells/uL (ref 850–3900)
MCH: 30.1 pg (ref 27.0–33.0)
MCHC: 32.8 g/dL (ref 32.0–36.0)
MCV: 91.8 fL (ref 80.0–100.0)
MPV: 10.9 fL (ref 7.5–12.5)
Monocytes Relative: 9.7 %
Neutro Abs: 5742 cells/uL (ref 1500–7800)
Neutrophils Relative %: 63.1 %
Platelets: 178 10*3/uL (ref 140–400)
RBC: 3.52 10*6/uL — ABNORMAL LOW (ref 3.80–5.10)
RDW: 14.1 % (ref 11.0–15.0)
Total Lymphocyte: 26.7 %
WBC: 9.1 10*3/uL (ref 3.8–10.8)

## 2019-10-17 LAB — COMPLETE METABOLIC PANEL WITH GFR
AG Ratio: 0.7 (calc) — ABNORMAL LOW (ref 1.0–2.5)
ALT: 63 U/L — ABNORMAL HIGH (ref 6–29)
AST: 40 U/L — ABNORMAL HIGH (ref 10–35)
Albumin: 3.6 g/dL (ref 3.6–5.1)
Alkaline phosphatase (APISO): 112 U/L (ref 31–125)
BUN: 11 mg/dL (ref 7–25)
CO2: 27 mmol/L (ref 20–32)
Calcium: 9.2 mg/dL (ref 8.6–10.2)
Chloride: 102 mmol/L (ref 98–110)
Creat: 0.92 mg/dL (ref 0.50–1.10)
GFR, Est African American: 87 mL/min/{1.73_m2} (ref >=60)
GFR, Est Non African American: 75 mL/min/{1.73_m2} (ref >=60)
Globulin: 5.2 g/dL (calc) — ABNORMAL HIGH (ref 1.9–3.7)
Glucose, Bld: 79 mg/dL (ref 65–99)
Potassium: 4.4 mmol/L (ref 3.5–5.3)
Sodium: 135 mmol/L (ref 135–146)
Total Bilirubin: 0.8 mg/dL (ref 0.2–1.2)
Total Protein: 8.8 g/dL — ABNORMAL HIGH (ref 6.1–8.1)

## 2019-10-19 ENCOUNTER — Encounter: Payer: Self-pay | Admitting: *Deleted

## 2019-10-20 ENCOUNTER — Encounter: Payer: Self-pay | Admitting: Family Medicine

## 2019-10-20 NOTE — Telephone Encounter (Addendum)
Received fax with new lab orders from [facility]~ Dr. Enis Gash- [phone] telephone/ [phone]~ fax.  Orders requested for CMP, PT/INR, and TMPT Genetics- Promethueus.   Per PCP, [facility] cannot guarantee courier will be able to send out as soon as drawn.   PCP recommendations are to contact patient. Send orders to patient and recommend patient take orders to [facility] for genetic testing order.   Call placed to patient and patient made aware. Patient requested we contact [facility] to determine where she can go to have labs obtained. Advised patient that she can contact labs per patient responsibility. Advised that we can fax, mail, e-mail orders, or patient can stop by to pick up.   Patient noted quite upset with conversation and requested to have MD contact her directly.   MD to be made aware.

## 2019-10-21 NOTE — Telephone Encounter (Signed)
Please fax a copy of lab orders  to pt office as well

## 2019-10-21 NOTE — Telephone Encounter (Signed)
Per patient request, orders emailed to: [email or web address]

## 2019-12-11 ENCOUNTER — Other Ambulatory Visit: Payer: 59

## 2019-12-11 DIAGNOSIS — K754 Autoimmune hepatitis: Secondary | ICD-10-CM

## 2019-12-11 LAB — COMPLETE METABOLIC PANEL WITH GFR
AG Ratio: 1.4 (calc) (ref 1.0–2.5)
ALT: 11 U/L (ref 6–29)
AST: 16 U/L (ref 10–35)
Albumin: 4.2 g/dL (ref 3.6–5.1)
Alkaline phosphatase (APISO): 39 U/L (ref 31–125)
BUN: 9 mg/dL (ref 7–25)
CO2: 25 mmol/L (ref 20–32)
Calcium: 9.3 mg/dL (ref 8.6–10.2)
Chloride: 105 mmol/L (ref 98–110)
Creat: 0.95 mg/dL (ref 0.50–1.10)
GFR, Est African American: 83 mL/min/{1.73_m2} (ref >=60)
GFR, Est Non African American: 72 mL/min/{1.73_m2} (ref >=60)
Globulin: 2.9 g/dL (calc) (ref 1.9–3.7)
Glucose, Bld: 83 mg/dL (ref 65–99)
Potassium: 3.7 mmol/L (ref 3.5–5.3)
Sodium: 138 mmol/L (ref 135–146)
Total Bilirubin: 0.7 mg/dL (ref 0.2–1.2)
Total Protein: 7.1 g/dL (ref 6.1–8.1)

## 2019-12-11 LAB — CBC WITH DIFFERENTIAL/PLATELET
Absolute Monocytes: 1063 cells/uL — ABNORMAL HIGH (ref 200–950)
Basophils Absolute: 43 cells/uL (ref 0–200)
Basophils Relative: 0.5 %
Eosinophils Absolute: 187 cells/uL (ref 15–500)
Eosinophils Relative: 2.2 %
HCT: 31.5 % — ABNORMAL LOW (ref 35.0–45.0)
Hemoglobin: 10.3 g/dL — ABNORMAL LOW (ref 11.7–15.5)
Lymphs Abs: 3179 cells/uL (ref 850–3900)
MCH: 28.3 pg (ref 27.0–33.0)
MCHC: 32.7 g/dL (ref 32.0–36.0)
MCV: 86.5 fL (ref 80.0–100.0)
MPV: 10.9 fL (ref 7.5–12.5)
Monocytes Relative: 12.5 %
Neutro Abs: 4029 cells/uL (ref 1500–7800)
Neutrophils Relative %: 47.4 %
Platelets: 159 10*3/uL (ref 140–400)
RBC: 3.64 10*6/uL — ABNORMAL LOW (ref 3.80–5.10)
RDW: 13.3 % (ref 11.0–15.0)
Total Lymphocyte: 37.4 %
WBC: 8.5 10*3/uL (ref 3.8–10.8)

## 2020-01-07 ENCOUNTER — Other Ambulatory Visit: Payer: 59

## 2020-01-07 DIAGNOSIS — K754 Autoimmune hepatitis: Secondary | ICD-10-CM

## 2020-01-08 LAB — CBC WITH DIFFERENTIAL/PLATELET
Absolute Monocytes: 475 cells/uL (ref 200–950)
Basophils Absolute: 20 cells/uL (ref 0–200)
Basophils Relative: 0.3 %
Eosinophils Absolute: 33 cells/uL (ref 15–500)
Eosinophils Relative: 0.5 %
HCT: 35.7 % (ref 35.0–45.0)
Hemoglobin: 11.3 g/dL — ABNORMAL LOW (ref 11.7–15.5)
Lymphs Abs: 845 cells/uL — ABNORMAL LOW (ref 850–3900)
MCH: 27.7 pg (ref 27.0–33.0)
MCHC: 31.7 g/dL — ABNORMAL LOW (ref 32.0–36.0)
MCV: 87.5 fL (ref 80.0–100.0)
MPV: 11.4 fL (ref 7.5–12.5)
Monocytes Relative: 7.3 %
Neutro Abs: 5129 cells/uL (ref 1500–7800)
Neutrophils Relative %: 78.9 %
Platelets: 181 10*3/uL (ref 140–400)
RBC: 4.08 10*6/uL (ref 3.80–5.10)
RDW: 14.6 % (ref 11.0–15.0)
Total Lymphocyte: 13 %
WBC: 6.5 10*3/uL (ref 3.8–10.8)

## 2020-01-08 LAB — COMPLETE METABOLIC PANEL WITH GFR
AG Ratio: 1.4 (calc) (ref 1.0–2.5)
ALT: 11 U/L (ref 6–29)
AST: 15 U/L (ref 10–35)
Albumin: 4.2 g/dL (ref 3.6–5.1)
Alkaline phosphatase (APISO): 38 U/L (ref 31–125)
BUN: 12 mg/dL (ref 7–25)
CO2: 23 mmol/L (ref 20–32)
Calcium: 9.4 mg/dL (ref 8.6–10.2)
Chloride: 104 mmol/L (ref 98–110)
Creat: 0.85 mg/dL (ref 0.50–1.10)
GFR, Est African American: 95 mL/min/{1.73_m2} (ref >=60)
GFR, Est Non African American: 82 mL/min/{1.73_m2} (ref >=60)
Globulin: 3.1 g/dL (calc) (ref 1.9–3.7)
Glucose, Bld: 107 mg/dL — ABNORMAL HIGH (ref 65–99)
Potassium: 4.1 mmol/L (ref 3.5–5.3)
Sodium: 136 mmol/L (ref 135–146)
Total Bilirubin: 1 mg/dL (ref 0.2–1.2)
Total Protein: 7.3 g/dL (ref 6.1–8.1)

## 2020-01-25 ENCOUNTER — Other Ambulatory Visit: Payer: Self-pay

## 2020-01-25 ENCOUNTER — Other Ambulatory Visit: Payer: 59

## 2020-01-25 DIAGNOSIS — K754 Autoimmune hepatitis: Secondary | ICD-10-CM

## 2020-01-25 LAB — CBC WITH DIFFERENTIAL/PLATELET
Absolute Monocytes: 731 cells/uL (ref 200–950)
Basophils Absolute: 34 cells/uL (ref 0–200)
Basophils Relative: 0.4 %
Eosinophils Absolute: 67 cells/uL (ref 15–500)
Eosinophils Relative: 0.8 %
HCT: 33.8 % — ABNORMAL LOW (ref 35.0–45.0)
Hemoglobin: 10.7 g/dL — ABNORMAL LOW (ref 11.7–15.5)
Lymphs Abs: 1184 cells/uL (ref 850–3900)
MCH: 27.9 pg (ref 27.0–33.0)
MCHC: 31.7 g/dL — ABNORMAL LOW (ref 32.0–36.0)
MCV: 88 fL (ref 80.0–100.0)
MPV: 11.4 fL (ref 7.5–12.5)
Monocytes Relative: 8.7 %
Neutro Abs: 6384 cells/uL (ref 1500–7800)
Neutrophils Relative %: 76 %
Platelets: 206 10*3/uL (ref 140–400)
RBC: 3.84 10*6/uL (ref 3.80–5.10)
RDW: 15.1 % — ABNORMAL HIGH (ref 11.0–15.0)
Total Lymphocyte: 14.1 %
WBC: 8.4 10*3/uL (ref 3.8–10.8)

## 2020-01-25 LAB — COMPLETE METABOLIC PANEL WITH GFR
AG Ratio: 1.3 (calc) (ref 1.0–2.5)
ALT: 9 U/L (ref 6–29)
AST: 16 U/L (ref 10–35)
Albumin: 4 g/dL (ref 3.6–5.1)
Alkaline phosphatase (APISO): 37 U/L (ref 31–125)
BUN: 11 mg/dL (ref 7–25)
CO2: 23 mmol/L (ref 20–32)
Calcium: 9.2 mg/dL (ref 8.6–10.2)
Chloride: 106 mmol/L (ref 98–110)
Creat: 0.87 mg/dL (ref 0.50–1.10)
GFR, Est African American: 93 mL/min/{1.73_m2} (ref >=60)
GFR, Est Non African American: 80 mL/min/{1.73_m2} (ref >=60)
Globulin: 3.1 g/dL (calc) (ref 1.9–3.7)
Glucose, Bld: 103 mg/dL — ABNORMAL HIGH (ref 65–99)
Potassium: 4.2 mmol/L (ref 3.5–5.3)
Sodium: 137 mmol/L (ref 135–146)
Total Bilirubin: 0.9 mg/dL (ref 0.2–1.2)
Total Protein: 7.1 g/dL (ref 6.1–8.1)

## 2020-01-26 ENCOUNTER — Encounter: Payer: Self-pay | Admitting: *Deleted

## 2020-02-08 ENCOUNTER — Other Ambulatory Visit: Payer: Self-pay

## 2020-02-08 ENCOUNTER — Other Ambulatory Visit: Payer: 59

## 2020-02-08 DIAGNOSIS — K754 Autoimmune hepatitis: Secondary | ICD-10-CM

## 2020-02-08 LAB — CBC WITH DIFFERENTIAL/PLATELET
Absolute Monocytes: 472 cells/uL (ref 200–950)
Basophils Absolute: 18 cells/uL (ref 0–200)
Basophils Relative: 0.3 %
Eosinophils Absolute: 30 cells/uL (ref 15–500)
Eosinophils Relative: 0.5 %
HCT: 32.7 % — ABNORMAL LOW (ref 35.0–45.0)
Hemoglobin: 10.6 g/dL — ABNORMAL LOW (ref 11.7–15.5)
Lymphs Abs: 950 cells/uL (ref 850–3900)
MCH: 27.5 pg (ref 27.0–33.0)
MCHC: 32.4 g/dL (ref 32.0–36.0)
MCV: 84.7 fL (ref 80.0–100.0)
MPV: 11.3 fL (ref 7.5–12.5)
Monocytes Relative: 8 %
Neutro Abs: 4431 cells/uL (ref 1500–7800)
Neutrophils Relative %: 75.1 %
Platelets: 204 10*3/uL (ref 140–400)
RBC: 3.86 10*6/uL (ref 3.80–5.10)
RDW: 15.2 % — ABNORMAL HIGH (ref 11.0–15.0)
Total Lymphocyte: 16.1 %
WBC: 5.9 10*3/uL (ref 3.8–10.8)

## 2020-02-08 LAB — COMPLETE METABOLIC PANEL WITH GFR
AG Ratio: 1.5 (calc) (ref 1.0–2.5)
ALT: 12 U/L (ref 6–29)
AST: 18 U/L (ref 10–35)
Albumin: 4.3 g/dL (ref 3.6–5.1)
Alkaline phosphatase (APISO): 39 U/L (ref 31–125)
BUN: 10 mg/dL (ref 7–25)
CO2: 26 mmol/L (ref 20–32)
Calcium: 9.4 mg/dL (ref 8.6–10.2)
Chloride: 104 mmol/L (ref 98–110)
Creat: 0.89 mg/dL (ref 0.50–1.10)
GFR, Est African American: 90 mL/min/{1.73_m2} (ref >=60)
GFR, Est Non African American: 78 mL/min/{1.73_m2} (ref >=60)
Globulin: 2.8 g/dL (calc) (ref 1.9–3.7)
Glucose, Bld: 105 mg/dL — ABNORMAL HIGH (ref 65–99)
Potassium: 4.8 mmol/L (ref 3.5–5.3)
Sodium: 138 mmol/L (ref 135–146)
Total Bilirubin: 0.7 mg/dL (ref 0.2–1.2)
Total Protein: 7.1 g/dL (ref 6.1–8.1)

## 2020-03-23 ENCOUNTER — Other Ambulatory Visit: Payer: Self-pay

## 2020-03-23 ENCOUNTER — Other Ambulatory Visit: Payer: 59

## 2020-03-23 DIAGNOSIS — K754 Autoimmune hepatitis: Secondary | ICD-10-CM

## 2020-03-24 LAB — CBC WITH DIFFERENTIAL/PLATELET
Absolute Monocytes: 605 cells/uL (ref 200–950)
Basophils Absolute: 22 cells/uL (ref 0–200)
Basophils Relative: 0.4 %
Eosinophils Absolute: 90 cells/uL (ref 15–500)
Eosinophils Relative: 1.6 %
HCT: 33.7 % — ABNORMAL LOW (ref 35.0–45.0)
Hemoglobin: 11.1 g/dL — ABNORMAL LOW (ref 11.7–15.5)
Lymphs Abs: 1019 cells/uL (ref 850–3900)
MCH: 29.2 pg (ref 27.0–33.0)
MCHC: 32.9 g/dL (ref 32.0–36.0)
MCV: 88.7 fL (ref 80.0–100.0)
MPV: 11.4 fL (ref 7.5–12.5)
Monocytes Relative: 10.8 %
Neutro Abs: 3864 cells/uL (ref 1500–7800)
Neutrophils Relative %: 69 %
Platelets: 195 10*3/uL (ref 140–400)
RBC: 3.8 10*6/uL (ref 3.80–5.10)
RDW: 14.7 % (ref 11.0–15.0)
Total Lymphocyte: 18.2 %
WBC: 5.6 10*3/uL (ref 3.8–10.8)

## 2020-03-24 LAB — COMPLETE METABOLIC PANEL WITH GFR
AG Ratio: 1.6 (calc) (ref 1.0–2.5)
ALT: 16 U/L (ref 6–29)
AST: 20 U/L (ref 10–35)
Albumin: 4.3 g/dL (ref 3.6–5.1)
Alkaline phosphatase (APISO): 43 U/L (ref 31–125)
BUN: 17 mg/dL (ref 7–25)
CO2: 25 mmol/L (ref 20–32)
Calcium: 9 mg/dL (ref 8.6–10.2)
Chloride: 105 mmol/L (ref 98–110)
Creat: 0.81 mg/dL (ref 0.50–1.10)
GFR, Est African American: 101 mL/min/{1.73_m2} (ref >=60)
GFR, Est Non African American: 87 mL/min/{1.73_m2} (ref >=60)
Globulin: 2.7 g/dL (calc) (ref 1.9–3.7)
Glucose, Bld: 86 mg/dL (ref 65–99)
Potassium: 4.4 mmol/L (ref 3.5–5.3)
Sodium: 139 mmol/L (ref 135–146)
Total Bilirubin: 1.1 mg/dL (ref 0.2–1.2)
Total Protein: 7 g/dL (ref 6.1–8.1)

## 2020-05-30 ENCOUNTER — Other Ambulatory Visit: Payer: Self-pay

## 2020-05-30 ENCOUNTER — Other Ambulatory Visit: Payer: 59

## 2020-05-30 DIAGNOSIS — K754 Autoimmune hepatitis: Secondary | ICD-10-CM

## 2020-05-31 LAB — CBC WITH DIFFERENTIAL/PLATELET
Absolute Monocytes: 540 cells/uL (ref 200–950)
Basophils Absolute: 22 cells/uL (ref 0–200)
Basophils Relative: 0.6 %
Eosinophils Absolute: 191 cells/uL (ref 15–500)
Eosinophils Relative: 5.3 %
HCT: 33.1 % — ABNORMAL LOW (ref 35.0–45.0)
Hemoglobin: 10.7 g/dL — ABNORMAL LOW (ref 11.7–15.5)
Lymphs Abs: 716 cells/uL — ABNORMAL LOW (ref 850–3900)
MCH: 27.2 pg (ref 27.0–33.0)
MCHC: 32.3 g/dL (ref 32.0–36.0)
MCV: 84.2 fL (ref 80.0–100.0)
MPV: 11.5 fL (ref 7.5–12.5)
Monocytes Relative: 15 %
Neutro Abs: 2131 cells/uL (ref 1500–7800)
Neutrophils Relative %: 59.2 %
Platelets: 159 10*3/uL (ref 140–400)
RBC: 3.93 10*6/uL (ref 3.80–5.10)
RDW: 13.8 % (ref 11.0–15.0)
Total Lymphocyte: 19.9 %
WBC: 3.6 10*3/uL — ABNORMAL LOW (ref 3.8–10.8)

## 2020-05-31 LAB — COMPLETE METABOLIC PANEL WITH GFR
AG Ratio: 1.3 (calc) (ref 1.0–2.5)
ALT: 16 U/L (ref 6–29)
AST: 29 U/L (ref 10–35)
Albumin: 4.1 g/dL (ref 3.6–5.1)
Alkaline phosphatase (APISO): 65 U/L (ref 31–125)
BUN: 10 mg/dL (ref 7–25)
CO2: 24 mmol/L (ref 20–32)
Calcium: 9 mg/dL (ref 8.6–10.2)
Chloride: 107 mmol/L (ref 98–110)
Creat: 0.72 mg/dL (ref 0.50–1.10)
GFR, Est African American: 116 mL/min/{1.73_m2} (ref >=60)
GFR, Est Non African American: 100 mL/min/{1.73_m2} (ref >=60)
Globulin: 3.1 g/dL (calc) (ref 1.9–3.7)
Glucose, Bld: 90 mg/dL (ref 65–99)
Potassium: 4.1 mmol/L (ref 3.5–5.3)
Sodium: 138 mmol/L (ref 135–146)
Total Bilirubin: 0.8 mg/dL (ref 0.2–1.2)
Total Protein: 7.2 g/dL (ref 6.1–8.1)

## 2020-06-10 ENCOUNTER — Ambulatory Visit: Payer: 59 | Admitting: Family Medicine

## 2020-06-10 ENCOUNTER — Encounter: Payer: Managed Care, Other (non HMO) | Admitting: Family Medicine

## 2021-01-16 ENCOUNTER — Encounter (HOSPITAL_BASED_OUTPATIENT_CLINIC_OR_DEPARTMENT_OTHER): Payer: Self-pay | Admitting: Obstetrics and Gynecology

## 2021-01-17 ENCOUNTER — Encounter (HOSPITAL_BASED_OUTPATIENT_CLINIC_OR_DEPARTMENT_OTHER): Payer: Self-pay | Admitting: Obstetrics and Gynecology

## 2021-01-17 ENCOUNTER — Other Ambulatory Visit: Payer: Self-pay

## 2021-01-17 NOTE — Progress Notes (Addendum)
ADDENDUM:  Chart reviewed by Dr Macario Golds M\DA,  agreed with [facility] am dos and also ordered for pt have have PT/ INR (order placed) otherwise ok to proceed pending lab results dos. Dr Corinna Capra pre-orders still pending. Labs dos--- CMP, PT/INR, Urine preg   Spoke w/ via phone for pre-op interview--- Pt Lab needs dos----   CMP and Urine preg (per anes)/  pre-op orders pending            Lab results------ COVID test -----patient states asymptomatic no test needed Arrive at ------- 0530 on 01-23-2021 NPO after MN NO Solid Food.  Clear liquids from MN until--- 0430 Med rec completed Medications to take morning of surgery ----- NONE Diabetic medication ----- n/a Patient instructed no nail polish to be worn day of surgery Patient instructed to bring photo id and insurance card day of surgery Patient aware to have Driver (ride ) --- sister-n-law, Marquita Palms / caregiver   for 24 hours after surgery -- husband, Elta Guadeloupe Patient Special Instructions ----- n/a Pre-Op special Istructions ----- sent inbox message in epic to dr lowe requested orders Patient verbalized understanding of instructions that were given at this phone interview. Patient denies shortness of breath, chest pain, fever, cough at this phone interview.    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  Pt stated dx by lab work in 2010 she has been asymptomatic.  PCP: Dr Raliegh Ip. [facility] Liver Care:  Dr Marland Kitchen (lov 05-30-2020 care everywhere) Last CT abd-- 02-26-2020, care everywhere

## 2021-01-19 NOTE — H&P (Addendum)
Veronica Estrada is an 45 y.o. female. Presents for surgical treatment of menorrhagia, multiple fibroids and aub which has caused anemia down to 6 Recent SHG shows multiple fibroids with a type 0 or 1 that is 4.5cm in size.  4 intramural fibroids measuring 4.4,3.7, 3.0, and 1.9cm and 4 subserosal fibroids the largest being 4.9cm She also has a history of endometriosis with a right endometrioma noted 4.7x 2.5cm She has had infertility with multiple SABs  Pertinent Gynecological History:  OB History: G3, P1   Menstrual History:  Patient's last menstrual period was 01/16/2021 (exact date).    Past Medical History:  Diagnosis Date   Autoimmune hepatitis ([place])    followed by [place]---  complicated by cirrhosis of liver;  per pt dx approx. 2010 was asymptomatic   IDA (iron deficiency anemia)    Menorrhagia    Other cirrhosis of liver ([facility])    Uterine fibroid    Wears contact lenses     Past Surgical History:  Procedure Laterality Date   BUNIONECTOMY Right 1999   CESAREAN SECTION  09/13/2009   '@[facility]'$    HYSTEROSCOPY WITH D & C  08/22/2006   '@[facility]'$     Family History  Problem Relation Age of Onset   Hyperlipidemia Mother    Hypertension Father    Heart disease Father        CHF   Hypothyroidism Sister    Cancer Brother        Renal cancer    Anesthesia problems Neg Hx    Hypotension Neg Hx    Malignant hyperthermia Neg Hx    Pseudochol deficiency Neg Hx     Social History:  reports that she has never smoked. She has never used smokeless tobacco. She reports that she does not drink alcohol and does not use drugs.  Allergies: No Known Allergies  No medications prior to admission.    Review of Systems  Height '5\' 9"'$  (1.753 m), weight 70.3 kg, last menstrual period 01/16/2021, not currently breastfeeding. Physical Exam Constitutional: General Appearance: healthy-appearing, well-nourished, and well-developed.  Psychiatric: Orientation: to time, place, and  person. Mood and Affect: normal mood and affect and active and alert.  Abdomen: Auscultation/Inspection/Palpation: no tenderness, hepatomegaly, splenomegaly, masses, or CVA tenderness and soft, non-distended, and normal bowel sounds. Hernia: none palpated.  reviewed previous [facility] Uterus 14x9x8cm Fibroids as above  No results found for this or any previous visit (from the past 24 hour(s)).  No results found.  Assessment/Plan: Menorrhagia Aub Fibroids including large submucosal v intracavitary fibroid Anemia We had a lengthy discussion regarding fibroids. intracavitary fibroid most likely creating significant menorrhagia. Discussed removal with hysteroscopic resection with myosure. discussed fibroid ablation techniques. Most of the intramural fibroids would be accessible with sonata ablation. Adah Perl might be limited if severe endometriotic disease present, but laparoscopy could allow removal of right ovarian endometrioma. . She wants myosure resection and sonata  01/23/21 0715 This patient has been seen and examined.   All of her questions were answered.  Labs and vital signs reviewed.  Informed consent has been obtained.  The History and Physical is current.  DL   Discussed the risks and benefits of the procedure including but not limited to infection, bleeding, damage to bowel, bladder, ureters, and risks associated with blood transfusion. We reviewed the procedure at length including the recovery. All here questions were answered and she gives informed consent.    Luz Lex 01/19/2021, 12:11 PM

## 2021-01-22 NOTE — Anesthesia Preprocedure Evaluation (Addendum)
Anesthesia Evaluation  Patient identified by MRN, date of birth, ID band Patient awake    Reviewed: Allergy & Precautions, NPO status , Patient's Chart, lab work & pertinent test results  History of Anesthesia Complications Negative for: history of anesthetic complications  Airway Mallampati: I  TM Distance: >3 FB Neck ROM: Full    Dental no notable dental hx. (+) Teeth Intact, Dental Advisory Given   Pulmonary neg pulmonary ROS,    Pulmonary exam normal breath sounds clear to auscultation       Cardiovascular negative cardio ROS Normal cardiovascular exam Rhythm:Regular Rate:Normal     Neuro/Psych negative neurological ROS  negative psych ROS   GI/Hepatic negative GI ROS, (+) Cirrhosis       , Hepatitis -, Autoimmune  Endo/Other  negative endocrine ROS  Renal/GU negative Renal ROS  negative genitourinary   Musculoskeletal negative musculoskeletal ROS (+)   Abdominal   Peds  Hematology  (+) anemia ,   Anesthesia Other Findings Day of surgery medications reviewed with patient.  Reproductive/Obstetrics FIBROIDS, MENORRHAGIA                            Anesthesia Physical Anesthesia Plan  ASA: 2  Anesthesia Plan: General   Post-op Pain Management:    Induction: Intravenous  PONV Risk Score and Plan: 3 and Treatment may vary due to age or medical condition, Midazolam, Ondansetron, Dexamethasone and Scopolamine patch - Pre-op  Airway Management Planned: LMA  Additional Equipment: None  Intra-op Plan:   Post-operative Plan: Extubation in OR  Informed Consent:   Plan Discussed with:   Anesthesia Plan Comments:         Anesthesia Quick Evaluation

## 2021-01-23 ENCOUNTER — Ambulatory Visit (HOSPITAL_BASED_OUTPATIENT_CLINIC_OR_DEPARTMENT_OTHER)
Admission: RE | Admit: 2021-01-23 | Discharge: 2021-01-23 | Disposition: A | Discharge status: Home / Self Care | Payer: 59 | Attending: Obstetrics and Gynecology | Admitting: Obstetrics and Gynecology

## 2021-01-23 ENCOUNTER — Encounter (HOSPITAL_BASED_OUTPATIENT_CLINIC_OR_DEPARTMENT_OTHER)
Admission: RE | Disposition: A | Discharge status: Home / Self Care | Payer: Self-pay | Source: Home / Self Care | Attending: Obstetrics and Gynecology

## 2021-01-23 ENCOUNTER — Ambulatory Visit (HOSPITAL_BASED_OUTPATIENT_CLINIC_OR_DEPARTMENT_OTHER): Payer: 59 | Admitting: Anesthesiology

## 2021-01-23 ENCOUNTER — Encounter (HOSPITAL_BASED_OUTPATIENT_CLINIC_OR_DEPARTMENT_OTHER): Payer: Self-pay | Admitting: Obstetrics and Gynecology

## 2021-01-23 DIAGNOSIS — D251 Intramural leiomyoma of uterus: Secondary | ICD-10-CM | POA: Insufficient documentation

## 2021-01-23 DIAGNOSIS — D649 Anemia, unspecified: Secondary | ICD-10-CM | POA: Insufficient documentation

## 2021-01-23 DIAGNOSIS — Z8349 Family history of other endocrine, nutritional and metabolic diseases: Secondary | ICD-10-CM | POA: Diagnosis not present

## 2021-01-23 DIAGNOSIS — Z8051 Family history of malignant neoplasm of kidney: Secondary | ICD-10-CM | POA: Insufficient documentation

## 2021-01-23 DIAGNOSIS — K7460 Unspecified cirrhosis of liver: Secondary | ICD-10-CM | POA: Insufficient documentation

## 2021-01-23 DIAGNOSIS — K754 Autoimmune hepatitis: Secondary | ICD-10-CM | POA: Insufficient documentation

## 2021-01-23 DIAGNOSIS — N920 Excessive and frequent menstruation with regular cycle: Secondary | ICD-10-CM | POA: Diagnosis present

## 2021-01-23 DIAGNOSIS — Z8249 Family history of ischemic heart disease and other diseases of the circulatory system: Secondary | ICD-10-CM | POA: Insufficient documentation

## 2021-01-23 HISTORY — DX: Presence of spectacles and contact lenses: Z97.3

## 2021-01-23 HISTORY — DX: Leiomyoma of uterus, unspecified: D25.9

## 2021-01-23 HISTORY — DX: Autoimmune hepatitis: K75.4

## 2021-01-23 HISTORY — DX: Iron deficiency anemia, unspecified: D50.9

## 2021-01-23 HISTORY — DX: Other cirrhosis of liver: K74.69

## 2021-01-23 HISTORY — DX: Excessive and frequent menstruation with regular cycle: N92.0

## 2021-01-23 HISTORY — PX: DILATATION & CURETTAGE/HYSTEROSCOPY WITH MYOSURE: SHX6511

## 2021-01-23 LAB — COMPREHENSIVE METABOLIC PANEL
ALT: 12 U/L (ref 0–44)
AST: 22 U/L (ref 15–41)
Albumin: 4 g/dL (ref 3.5–5.0)
Alkaline Phosphatase: 44 U/L (ref 38–126)
Anion gap: 6 (ref 5–15)
BUN: 9 mg/dL (ref 6–20)
CO2: 24 mmol/L (ref 22–32)
Calcium: 9.1 mg/dL (ref 8.9–10.3)
Chloride: 106 mmol/L (ref 98–111)
Creatinine, Ser: 0.77 mg/dL (ref 0.44–1.00)
GFR, Estimated: >60 mL/min (ref >60)
Glucose, Bld: 90 mg/dL (ref 70–99)
Potassium: 4.5 mmol/L (ref 3.5–5.1)
Sodium: 136 mmol/L (ref 135–145)
Total Bilirubin: 1 mg/dL (ref 0.3–1.2)
Total Protein: 7.2 g/dL (ref 6.5–8.1)

## 2021-01-23 LAB — CBC
HCT: 35.1 % — ABNORMAL LOW (ref 36.0–46.0)
Hemoglobin: 11.8 g/dL — ABNORMAL LOW (ref 12.0–15.0)
MCH: 29.9 pg (ref 26.0–34.0)
MCHC: 33.6 g/dL (ref 30.0–36.0)
MCV: 89.1 fL (ref 80.0–100.0)
Platelets: 217 10*3/uL (ref 150–400)
RBC: 3.94 MIL/uL (ref 3.87–5.11)
RDW: 14.1 % (ref 11.5–15.5)
WBC: 4.7 10*3/uL (ref 4.0–10.5)
nRBC: 0 % (ref 0.0–0.2)

## 2021-01-23 LAB — TYPE AND SCREEN
ABO/RH(D): A POS
Antibody Screen: NEGATIVE

## 2021-01-23 LAB — POCT PREGNANCY, URINE: Preg Test, Ur: NEGATIVE

## 2021-01-23 LAB — PROTIME-INR
INR: 1.1 (ref 0.8–1.2)
Prothrombin Time: 13.9 seconds (ref 11.4–15.2)

## 2021-01-23 SURGERY — DILATATION & CURETTAGE/HYSTEROSCOPY WITH MYOSURE
Anesthesia: General | Site: Uterus

## 2021-01-23 MED ORDER — FENTANYL CITRATE (PF) 100 MCG/2ML IJ SOLN
INTRAMUSCULAR | Status: AC
  Filled 2021-01-23: qty 2

## 2021-01-23 MED ORDER — STERILE WATER FOR IRRIGATION IR SOLN
Status: DC | PRN
  Administered 2021-01-23: 1000 mL

## 2021-01-23 MED ORDER — MIDAZOLAM HCL 2 MG/2ML IJ SOLN
INTRAMUSCULAR | Status: DC | PRN
  Administered 2021-01-23: 1 mg via INTRAVENOUS

## 2021-01-23 MED ORDER — OXYCODONE HCL 5 MG PO TABS
5.0000 mg | ORAL_TABLET | Freq: Once | ORAL | Status: AC | PRN
  Administered 2021-01-23: 5 mg via ORAL

## 2021-01-23 MED ORDER — GLYCOPYRROLATE PF 0.2 MG/ML IJ SOSY
PREFILLED_SYRINGE | INTRAMUSCULAR | Status: AC
  Filled 2021-01-23: qty 1

## 2021-01-23 MED ORDER — PHENYLEPHRINE HCL (PRESSORS) 10 MG/ML IV SOLN
INTRAVENOUS | Status: DC | PRN
  Administered 2021-01-23 (×2): 80 ug via INTRAVENOUS

## 2021-01-23 MED ORDER — MIDAZOLAM HCL 2 MG/2ML IJ SOLN
INTRAMUSCULAR | Status: AC
  Filled 2021-01-23: qty 2

## 2021-01-23 MED ORDER — PROMETHAZINE HCL 25 MG/ML IJ SOLN
6.2500 mg | INTRAMUSCULAR | Status: DC | PRN
Start: 2021-01-23 — End: 2021-01-23

## 2021-01-23 MED ORDER — GLYCOPYRROLATE 0.2 MG/ML IJ SOLN
INTRAMUSCULAR | Status: DC | PRN
  Administered 2021-01-23 (×2): .1 mg via INTRAVENOUS

## 2021-01-23 MED ORDER — IBUPROFEN 800 MG PO TABS: 800.0000 mg | ORAL_TABLET | Freq: Three times a day (TID) | ORAL | 0 refills | Status: AC | PRN

## 2021-01-23 MED ORDER — SCOPOLAMINE 1 MG/3DAYS TD PT72
MEDICATED_PATCH | TRANSDERMAL | Status: AC
  Filled 2021-01-23: qty 1

## 2021-01-23 MED ORDER — FENTANYL CITRATE (PF) 100 MCG/2ML IJ SOLN: 25.0000 ug | INTRAMUSCULAR | Status: DC | PRN

## 2021-01-23 MED ORDER — KETOROLAC TROMETHAMINE 30 MG/ML IJ SOLN
INTRAMUSCULAR | Status: DC | PRN
  Administered 2021-01-23: 30 mg via INTRAVENOUS

## 2021-01-23 MED ORDER — FERRIC SUBSULFATE 259 MG/GM EX SOLN
CUTANEOUS | Status: DC | PRN
  Administered 2021-01-23: 1

## 2021-01-23 MED ORDER — LACTATED RINGERS IV SOLN: INTRAVENOUS | Status: DC

## 2021-01-23 MED ORDER — ACETAMINOPHEN 500 MG PO TABS
ORAL_TABLET | ORAL | Status: AC
  Filled 2021-01-23: qty 2

## 2021-01-23 MED ORDER — OXYCODONE HCL 5 MG PO TABS
ORAL_TABLET | ORAL | Status: AC
  Filled 2021-01-23: qty 1

## 2021-01-23 MED ORDER — OXYCODONE HCL 5 MG/5ML PO SOLN: 5.0000 mg | Freq: Once | ORAL | Status: AC | PRN

## 2021-01-23 MED ORDER — LIDOCAINE HCL (CARDIAC) PF 100 MG/5ML IV SOSY
PREFILLED_SYRINGE | INTRAVENOUS | Status: DC | PRN
  Administered 2021-01-23: 60 mg via INTRAVENOUS

## 2021-01-23 MED ORDER — PHENYLEPHRINE 40 MCG/ML (10ML) SYRINGE FOR IV PUSH (FOR BLOOD PRESSURE SUPPORT)
PREFILLED_SYRINGE | INTRAVENOUS | Status: AC
  Filled 2021-01-23: qty 10

## 2021-01-23 MED ORDER — DEXAMETHASONE SODIUM PHOSPHATE 4 MG/ML IJ SOLN
INTRAMUSCULAR | Status: DC | PRN
  Administered 2021-01-23: 8 mg via INTRAVENOUS

## 2021-01-23 MED ORDER — POVIDONE-IODINE 10 % EX SWAB: 2.0000 "application " | Freq: Once | CUTANEOUS | Status: DC

## 2021-01-23 MED ORDER — FENTANYL CITRATE (PF) 100 MCG/2ML IJ SOLN
INTRAMUSCULAR | Status: DC | PRN
  Administered 2021-01-23: 25 ug via INTRAVENOUS
  Administered 2021-01-23 (×2): 50 ug via INTRAVENOUS
  Administered 2021-01-23: 25 ug via INTRAVENOUS

## 2021-01-23 MED ORDER — ACETAMINOPHEN 500 MG PO TABS
1000.0000 mg | ORAL_TABLET | Freq: Once | ORAL | Status: AC
  Administered 2021-01-23: 1000 mg via ORAL

## 2021-01-23 MED ORDER — SCOPOLAMINE 1 MG/3DAYS TD PT72
1.0000 | MEDICATED_PATCH | Freq: Once | TRANSDERMAL | Status: DC
  Administered 2021-01-23: 1.5 mg via TRANSDERMAL

## 2021-01-23 MED ORDER — SODIUM CHLORIDE 0.9 % IV SOLN
2.0000 g | INTRAVENOUS | Status: AC
  Administered 2021-01-23: 2 g via INTRAVENOUS
  Filled 2021-01-23: qty 2

## 2021-01-23 MED ORDER — PROPOFOL 10 MG/ML IV BOLUS
INTRAVENOUS | Status: DC | PRN
  Administered 2021-01-23: 40 mg via INTRAVENOUS
  Administered 2021-01-23: 150 mg via INTRAVENOUS

## 2021-01-23 MED ORDER — KETOROLAC TROMETHAMINE 30 MG/ML IJ SOLN
INTRAMUSCULAR | Status: AC
  Filled 2021-01-23: qty 1

## 2021-01-23 MED ORDER — ONDANSETRON HCL 4 MG/2ML IJ SOLN
INTRAMUSCULAR | Status: DC | PRN
  Administered 2021-01-23: 4 mg via INTRAVENOUS

## 2021-01-23 MED ORDER — PROPOFOL 10 MG/ML IV BOLUS
INTRAVENOUS | Status: AC
  Filled 2021-01-23: qty 40

## 2021-01-23 MED ORDER — SODIUM CHLORIDE 0.9 % IR SOLN
Status: DC | PRN
  Administered 2021-01-23 (×4): 3000 mL

## 2021-01-23 SURGICAL SUPPLY — 37 items
BIPOLAR CUTTING LOOP 21FR (ELECTRODE)
CATH ROBINSON RED A/P 16FR (CATHETERS) ×3 IMPLANT
COUNTER NEEDLE 1200 MAGNETIC (NEEDLE) ×1 IMPLANT
DEVICE MYOSURE LITE (MISCELLANEOUS) IMPLANT
DEVICE MYOSURE REACH (MISCELLANEOUS) IMPLANT
DILATOR CANAL MILEX (MISCELLANEOUS) IMPLANT
ELECT DISPERSIVE SONATA (MISCELLANEOUS) ×3 IMPLANT
ELECT REM PT RETURN 9FT ADLT (ELECTROSURGICAL)
ELECTRODE REM PT RTRN 9FT ADLT (ELECTROSURGICAL) IMPLANT
GAUZE 4X4 16PLY ~~LOC~~+RFID DBL (SPONGE) ×3 IMPLANT
GLOVE SURG ENC MOIS LTX SZ8 (GLOVE) ×2 IMPLANT
GLOVE SURG ORTHO LTX SZ8 (GLOVE) ×4 IMPLANT
GLOVE SURG UNDER POLY LF SZ6.5 (GLOVE) ×4 IMPLANT
GLOVE SURG UNDER POLY LF SZ7 (GLOVE) ×1 IMPLANT
GOWN STRL REUS W/ TWL XL LVL3 (GOWN DISPOSABLE) ×1 IMPLANT
GOWN STRL REUS W/TWL LRG LVL3 (GOWN DISPOSABLE) ×3 IMPLANT
GOWN STRL REUS W/TWL XL LVL3 (GOWN DISPOSABLE) ×4 IMPLANT
HANDPIECE RFA SONATA (MISCELLANEOUS) ×2 IMPLANT
IV NS 1000ML (IV SOLUTION) ×6
IV NS 1000ML BAXH (IV SOLUTION) IMPLANT
IV NS IRRIG 3000ML ARTHROMATIC (IV SOLUTION) ×4 IMPLANT
KIT PROCEDURE FLUENT (KITS) ×2 IMPLANT
KIT TURNOVER CYSTO (KITS) ×2 IMPLANT
LOOP CUTTING BIPOLAR 21FR (ELECTRODE) IMPLANT
MYOSURE XL FIBROID (MISCELLANEOUS) ×2
PACK VAGINAL MINOR WOMEN LF (CUSTOM PROCEDURE TRAY) ×2 IMPLANT
PAD OB MATERNITY 4.3X12.25 (PERSONAL CARE ITEMS) ×2 IMPLANT
PAD PREP 24X48 CUFFED NSTRL (MISCELLANEOUS) ×2 IMPLANT
SCOPETTES 8  STERILE (MISCELLANEOUS) ×2
SCOPETTES 8 STERILE (MISCELLANEOUS) IMPLANT
SEAL CERVICAL OMNI LOK (ABLATOR) IMPLANT
SEAL ROD LENS SCOPE MYOSURE (ABLATOR) ×2 IMPLANT
SYR 50ML LL SCALE MARK (SYRINGE) ×2 IMPLANT
SYSTEM TISS REMOVAL MYOSURE XL (MISCELLANEOUS) IMPLANT
TOWEL OR 17X26 10 PK STRL BLUE (TOWEL DISPOSABLE) ×2 IMPLANT
WATER STERILE IRR 1000ML POUR (IV SOLUTION) ×1 IMPLANT
WATER STERILE IRR 500ML POUR (IV SOLUTION) ×1 IMPLANT

## 2021-01-23 NOTE — Transfer of Care (Signed)
Immediate Anesthesia Transfer of Care Note  Patient: [name]  Procedure(s) Performed: DILATATION & CURETTAGE/HYSTEROSCOPY WITH MYOSURE (Uterus) Radio Frequency Ablation with Sonata (Uterus)  Patient Location: PACU  Anesthesia Type:General  Level of Consciousness: awake and patient cooperative  Airway & Oxygen Therapy: Patient Spontanous Breathing and Patient connected to nasal cannula oxygen  Post-op Assessment: Report given to RN and Post -op Vital signs reviewed and stable  Post vital signs: Reviewed and stable  Last Vitals:  Vitals Value Taken Time  BP 107/71 01/23/21 1015  Temp    Pulse 72 01/23/21 1018  Resp 19 01/23/21 1018  SpO2 100 % 01/23/21 1018  Vitals shown include unvalidated device data.  Last Pain:  Vitals:   01/23/21 0546  TempSrc: Oral  PainSc: 0-No pain      Patients Stated Pain Goal: 3 ([id number])  Complications: No notable events documented.

## 2021-01-23 NOTE — Discharge Instructions (Addendum)
DISCHARGE INSTRUCTIONS: D&C / D&E The following instructions have been prepared to help you care for yourself upon your return home.   Personal hygiene:  Use sanitary pads for vaginal drainage, not tampons.  Shower the day after your procedure.  NO tub baths, pools or Jacuzzis for 2-3 weeks.  Wipe front to back after using the bathroom.  Activity and limitations:  Do NOT drive or operate any equipment for 24 hours. The effects of anesthesia are still present and drowsiness may result.  Do NOT rest in bed all day.  Walking is encouraged.  Walk up and down stairs slowly.  You may resume your normal activity in one to two days or as indicated by your physician.  Sexual activity: NO intercourse for at least 2 weeks after the procedure, or as indicated by your physician.  Diet: Eat a light meal as desired this evening. You may resume your usual diet tomorrow.  Return to work: You may resume your work activities in one to two days or as indicated by your doctor.  What to expect after your surgery: Expect to have vaginal bleeding/discharge for 2-3 days and spotting for up to 10 days. It is not unusual to have soreness for up to 1-2 weeks. You may have a slight burning sensation when you urinate for the first day. Mild cramps may continue for a couple of days. You may have a regular period in 2-6 weeks.  Call your doctor for any of the following:  Excessive vaginal bleeding, saturating and changing one pad every hour.  Inability to urinate 6 hours after discharge from hospital.  Pain not relieved by pain medication.  Fever of 100.4 F or greater.  Unusual vaginal discharge or odor.   Call for an appointment:      Post Anesthesia Home Care Instructions  Activity: Get plenty of rest for the remainder of the day. A responsible adult should stay with you for 24 hours following the procedure.  For the next 24 hours, DO NOT: -Drive a car -Paediatric nurse -Drink alcoholic  beverages -Take any medication unless instructed by your physician -Make any legal decisions or sign important papers.  Meals: Start with liquid foods such as gelatin or soup. Progress to regular foods as tolerated. Avoid greasy, spicy, heavy foods. If nausea and/or vomiting occur, drink only clear liquids until the nausea and/or vomiting subsides. Call your physician if vomiting continues.  Special Instructions/Symptoms: Your throat may feel dry or sore from the anesthesia or the breathing tube placed in your throat during surgery. If this causes discomfort, gargle with warm salt water. The discomfort should disappear within 24 hours.  If you had a scopolamine patch placed behind your ear for the management of post- operative nausea and/or vomiting:  1. The medication in the patch is effective for 72 hours, after which it should be removed.  Wrap patch in a tissue and discard in the trash. Wash hands thoroughly with soap and water. 2. You may remove the patch earlier than 72 hours if you experience unpleasant side effects which may include dry mouth, dizziness or visual disturbances. 3. Avoid touching the patch. Wash your hands with soap and water after contact with the patch.

## 2021-01-23 NOTE — Anesthesia Procedure Notes (Signed)
Procedure Name: LMA Insertion Date/Time: 01/23/2021 7:36 AM Performed by: Georgeanne Nim, CRNA Pre-anesthesia Checklist: Patient identified, Emergency Drugs available, Suction available, Patient being monitored and Timeout performed Patient Re-evaluated:Patient Re-evaluated prior to induction Oxygen Delivery Method: Circle system utilized Preoxygenation: Pre-oxygenation with 100% oxygen Induction Type: IV induction Ventilation: Mask ventilation without difficulty LMA: LMA inserted LMA Size: 4.0 Number of attempts: 1 Placement Confirmation: positive ETCO2, CO2 detector and breath sounds checked- equal and bilateral Tube secured with: Tape Dental Injury: Teeth and Oropharynx as per pre-operative assessment

## 2021-01-23 NOTE — Progress Notes (Signed)
Received in Phase 2 from PACU, report from T. Bobby Rumpf, RN. Placed in recliner and is taking PO fluids and eating crackers.

## 2021-01-23 NOTE — Anesthesia Postprocedure Evaluation (Signed)
Anesthesia Post Note  Patient: Annastasia Rena Eggleston-Clark  Procedure(s) Performed: DILATATION & CURETTAGE/HYSTEROSCOPY WITH MYOSURE (Uterus) Radio Frequency Ablation with Sonata (Uterus)     Patient location during evaluation: PACU Anesthesia Type: General Level of consciousness: awake and alert and oriented Pain management: pain level controlled Vital Signs Assessment: post-procedure vital signs reviewed and stable Respiratory status: spontaneous breathing, nonlabored ventilation and respiratory function stable Cardiovascular status: blood pressure returned to baseline Postop Assessment: no apparent nausea or vomiting Anesthetic complications: no   No notable events documented.  Last Vitals:  Vitals:   01/23/21 1055 01/23/21 1225  BP:  111/70  Pulse: 85 65  Resp: 13 12  Temp:  36.7 C  SpO2: 100% 99%    Last Pain:  Vitals:   01/23/21 1225  TempSrc:   PainSc: 0-No pain                 Brennan Bailey

## 2021-01-24 ENCOUNTER — Encounter (HOSPITAL_BASED_OUTPATIENT_CLINIC_OR_DEPARTMENT_OTHER): Payer: Self-pay | Admitting: Obstetrics and Gynecology

## 2021-01-24 LAB — SURGICAL PATHOLOGY

## 2021-01-24 NOTE — Op Note (Signed)
NAME: Veronica Estrada, Veronica Estrada MEDICAL RECORD NO: [id number] ACCOUNT NO: [id number] DATE OF BIRTH: Nov 14, 1975 FACILITY: [facility] LOCATION: [facility] PHYSICIAN: Monia Sabal. Corinna Capra, MD  Operative Report   DATE OF PROCEDURE: 01/23/2021  PREOPERATIVE DIAGNOSES:  Menorrhagia, abnormal uterine bleeding, multiple large uterine fibroids, anemia.  POSTOPERATIVE DIAGNOSES:  Menorrhagia, abnormal uterine bleeding, multiple large uterine fibroids, anemia.  PROCEDURE:  Hysteroscopic evaluation of the intrauterine cavity, MyoSure resection of intracavitary fibroid, Sonata transcervical ablation of multiple leiomyomas.  SURGEON:  Louretta Shorten, MD  ANESTHESIA:  General endotracheal.  INDICATIONS:  The patient is a 45 year old with abnormal uterine bleeding, menorrhagia, with anemia down to as low as 6.  She has multiple large uterine fibroids including one that appears to be intracavitary.  She desires surgical intervention and  requests hysteroscopic resection of the intracavitary fibroid and uterine fibroid ablation using the Sonata transcervical ablation technique.  We discussed the procedure at length, its risks, benefits, pros and cons.  She does give her informed consent  and wished to proceed.  See history and physical for further details.  FINDINGS: At the time of surgery, multiple large fibroids as documented in history and physical and a large intracavitary fibroid type 0 which is 4.5 cm in size.  DESCRIPTION OF PROCEDURE:  After adequate analgesia, the patient was placed in dorsal lithotomy position.  She was sterilely prepped and draped.  Bladder was sterilely drained.  A tenaculum was placed on the anterior lip of the cervix.  Uterus was  sounded to a #27 [place] dilator.  Hysteroscope inserted, revealing a large 4.5 cm fundal type 0 fibroid, appeared to be calcified in appearance.  We removed the hysteroscope and inserted the Sonata handpiece to perform ultrasound, but was unable to  insert all  the way due to the large fibroid within the endometrial cavity.  Sonata device was then removed.  MyoSure was inserted and under direct visualization, began resection of the fibroid due to its large calcified area.  It took nearly 25 minutes  to resect approximately 80% of the fibroid at which time the deficit approached to the maximum was 2500 mL and the device shut down so we had no more visualization for further resection.  At that time, 80% of it was removed with good hemostasis achieved and only  the small 20% remained near the cavity.  The hysteroscope was then removed.  The Sonata handpiece was then inserted and a complete uterine survey was performed with the ultrasound.  Sonata of fibroids were then performed.  Began with fibroid #1, which  was 4 cm in size at the 6 o'clock position.  Fibroid type was a type 2 through a type 5.  The ablation #1 was 3.4 x 2.5 for 3 minutes and 42 seconds. The ablation 2 was 3.5 x 2.6 at 3 minutes and 54 seconds and ablation #3 was 2.6 x 1.9, which was 2  minutes and 18 seconds.  This appeared to be a series of fibroids with good technique noted and care to avoid the serosa of the endometrium.  Fibroid #2 was a 3 cm fibroid noted at the 12 o'clock position, type 4 fibroid.  Ablation #1 was 3.5 x 2.7 cm in  size for 4 minutes and 6 seconds, second ablation was 3.1 x 2.3 for 3 minutes and 12 seconds.  This was a mid anterior fundal fibroid.  The first fibroid was a mid posterior fundal.  After success, we removed fibroid #3, which was a 4.5 cm fibroid noted  at  the 12 o'clock position.  It was mid anterior in the lower segment, type 2 through type 5.  Ablation was 4.2 x 3.4 cm for 5 minutes and 30 seconds.  The second ablation was 4 x 3.3 for 5 minutes.  All cycles were carried out under direct  visualization and treatment guidance and visualization of the ablation guide, noted to be within the serosa at all times. The fibroids treated and appeared to be ablated using  the ultrasound guidance noted by the ultrasound appearance.  The  hysteroscopy was then performed, after removal of the device, demonstrated complete ablation on the submucosal surface where the fibroids previously were.  The hysteroscope was then removed.  Tenaculum removed from the cervix, noted to be hemostatic.   Sponge and instrument count was normal x3.  All instruments were removed from the vagina.  The patient was stable and transferred to the recovery room.  DISPOSITION:  The patient will be discharged home, will follow up in the office in 2-3 weeks per routine instruction sheet for hysteroscopy, D and C, return for increased pain, fever, bleeding.  Also sent her with a prescription for Motrin 8 mg every 8  hours for the next several days and then p.r.n.   SHW D: 01/23/2021 10:19:30 pm T: 01/24/2021 12:32:00 am  JOB: [id number]

## 2021-10-18 ENCOUNTER — Other Ambulatory Visit: Payer: Self-pay | Admitting: Obstetrics and Gynecology
